# Patient Record
Sex: Male | Born: 1940 | Race: White | Hispanic: No | Marital: Single
Health system: Midwestern US, Academic
[De-identification: ages and names within clinical notes are randomized; demographics above are authoritative.]

---

## 2020-08-05 IMAGING — CT Head^_WITHOUT_CONTRAST (Adult)
1 series · 15 of 30 positions shown, 19 images · non-contrast
Comparison: none

[Series 2: brain w/o 4.8 brain · axial · non-contrast · 0.55mm/px · z∈[+84,+251]mm · 15 of 36 slices shown, 19 images]
[im 2/36  brain]
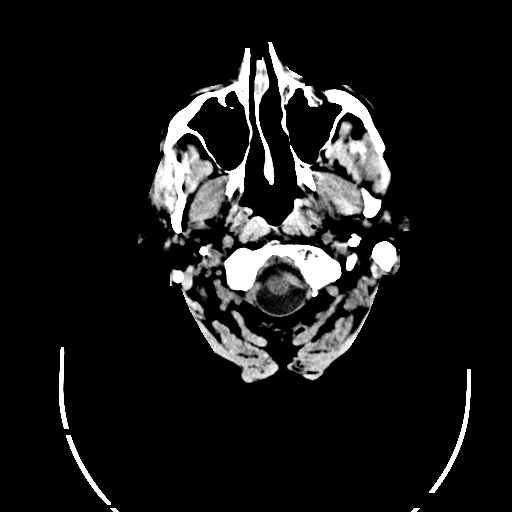
[im 2/36  bone]
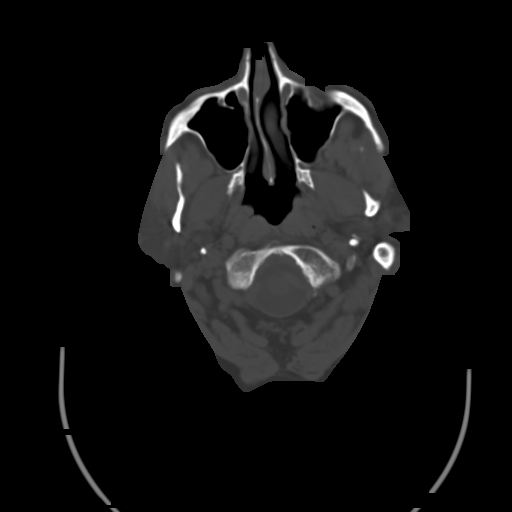
[im 4/36  brain]
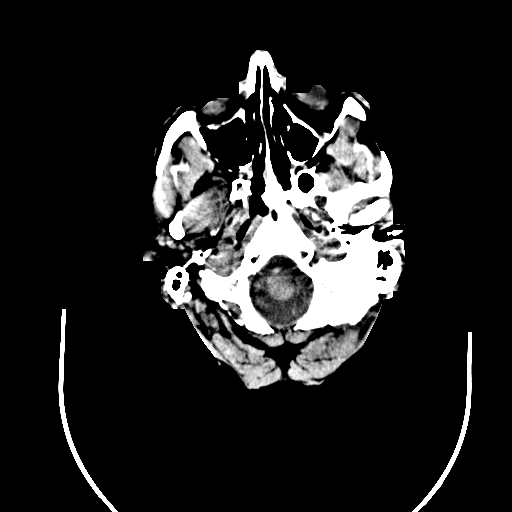
[im 7/36  brain]
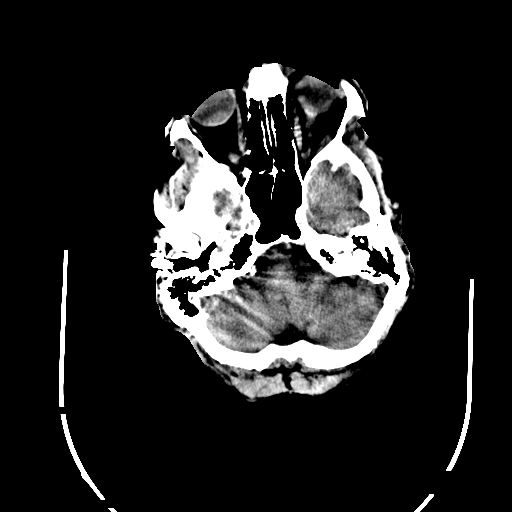
[im 9/36  brain]
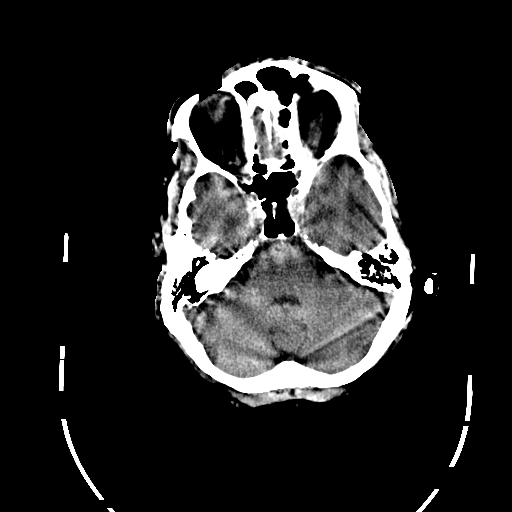
[im 11/36  brain]
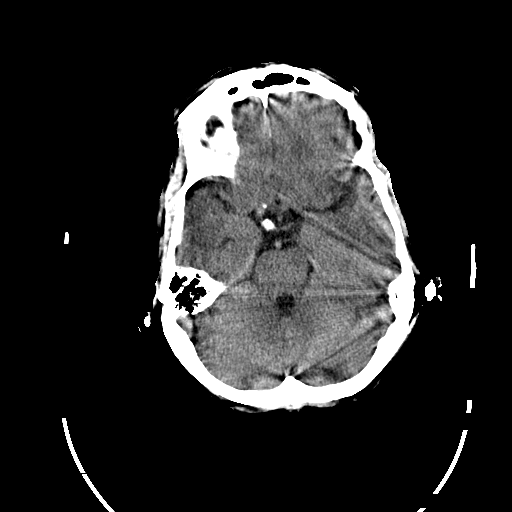
[im 11/36  bone]
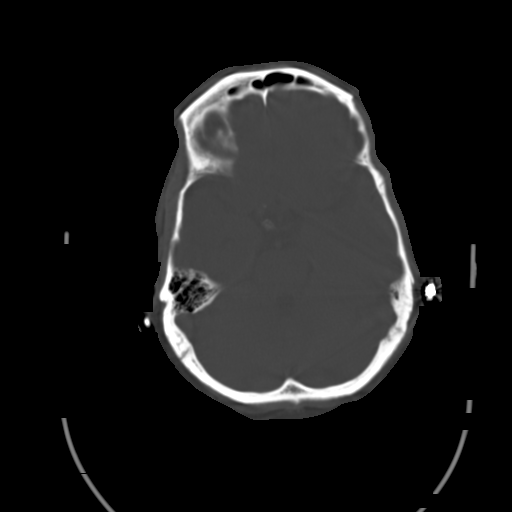
[im 14/36  brain]
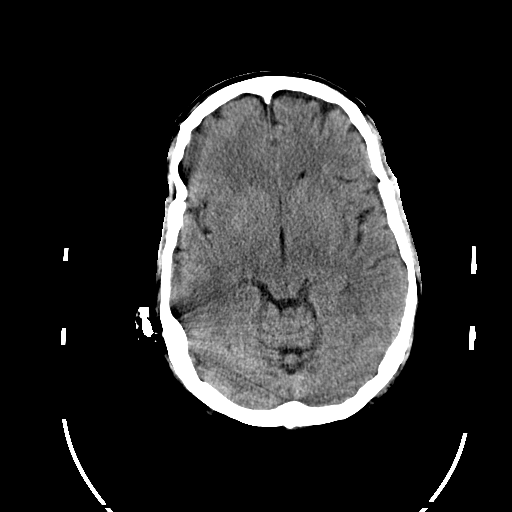
[im 16/36  brain]
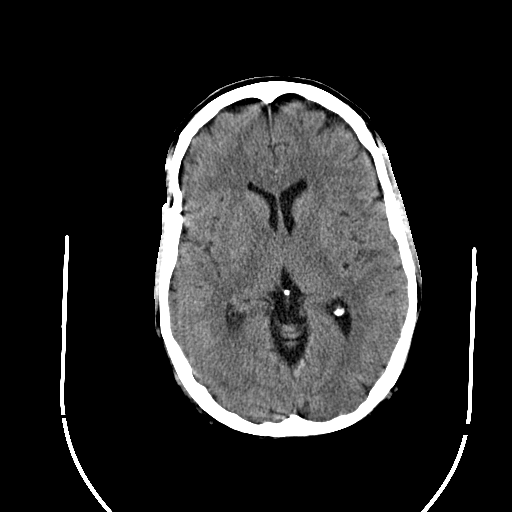
[im 19/36  brain]
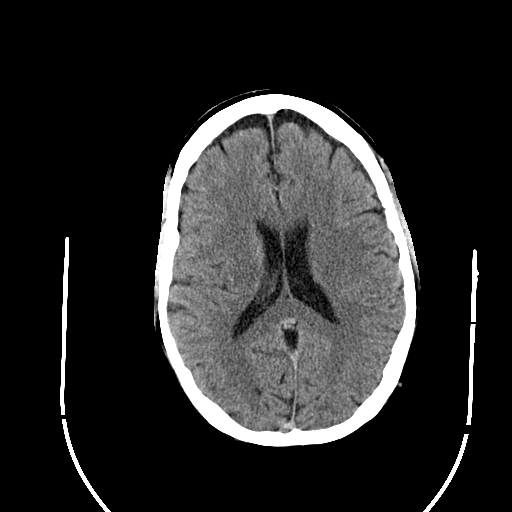
[im 20/36  brain]
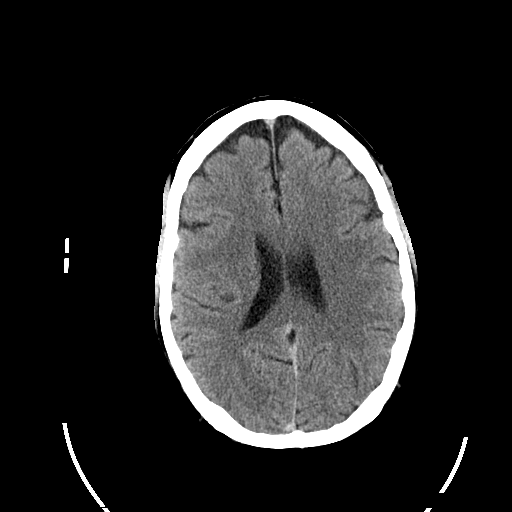
[im 20/36  bone]
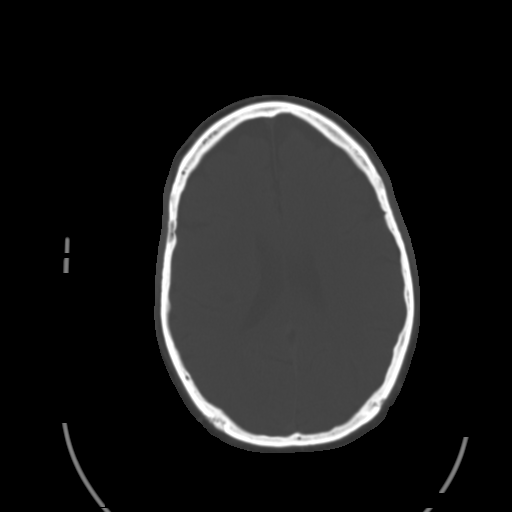
[im 22/36  brain]
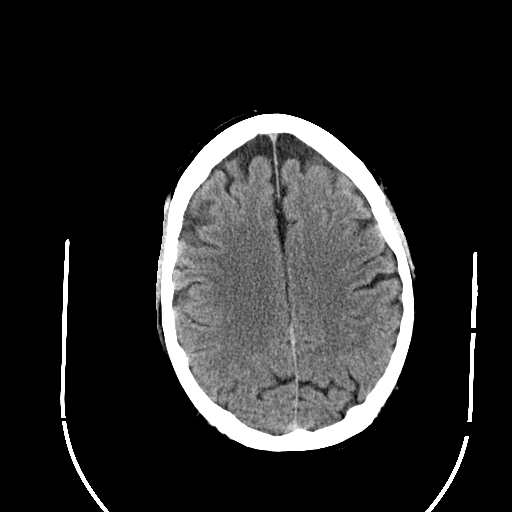
[im 25/36  brain]
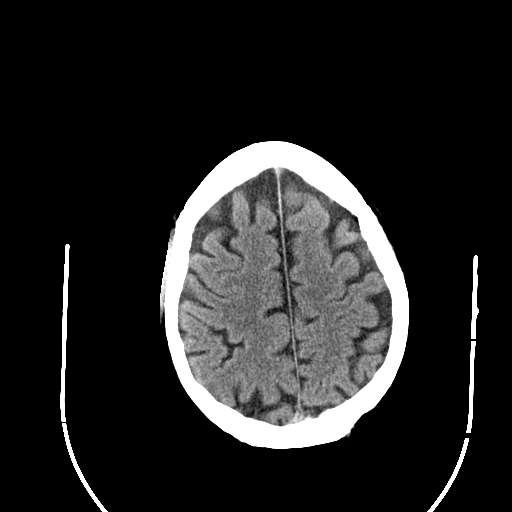
[im 27/36  brain]
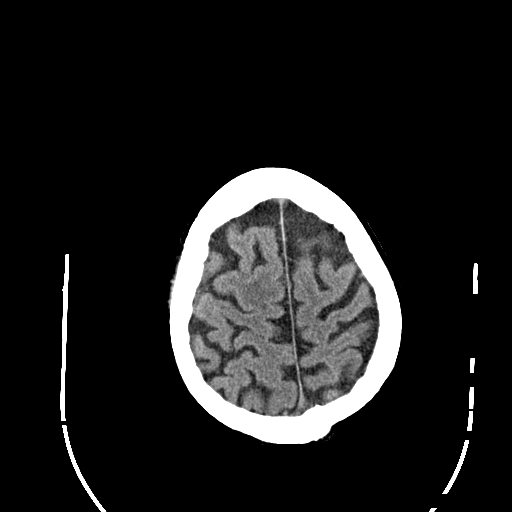
[im 29/36  brain]
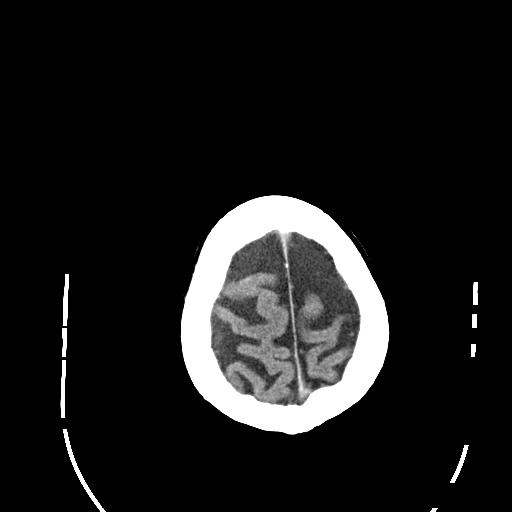
[im 29/36  bone]
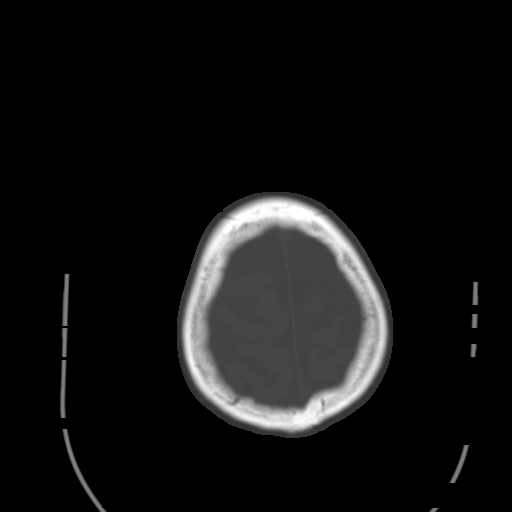
[im 32/36  brain]
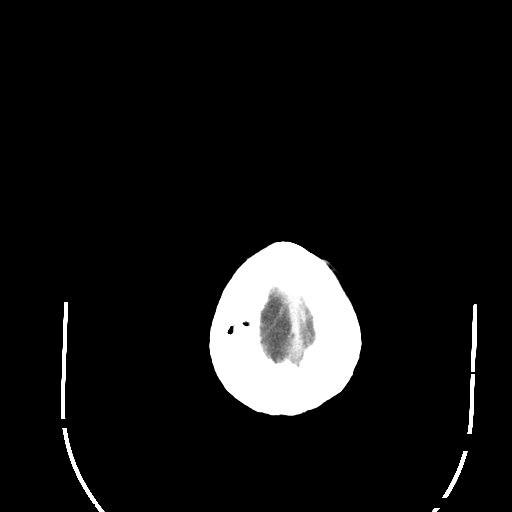
[im 34/36  brain]
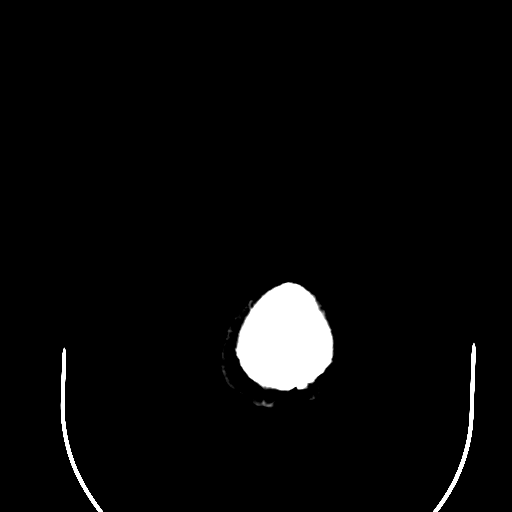

[15 of 30 positions shown; findings below may reference images not displayed]

EXAM

COMPUTED TOMOGRAPHY, HEAD OR BRAIN; WITHOUT CONTRAST MATERIAL CPT 71301

INDICATION

dizzy near syncope
Pt c/o dizziness and near syncope. AW

TECHNIQUE

Multiple contiguous transaxial images were obtained through the brain utilizing a multidetector CT
scanner.

All CT scans at this facility use dose modulation, iterative reconstruction, and/or weight based
dosing when appropriate to reduce radiation dose to as low as reasonably achievable.

COMPARISONS

There are no previous examinations available for comparison at the time of dictation.

FINDINGS

There is diffuse cortical atrophy. The ventricles and sulci are accordingly prominent. There is no
mass effect or shift in the midline structures. Lucency throughout the centrum ovale is consistent
with deep white matter ischemic changes and periventricular leukomalacia. The skull base overlying
and overlying calvarium are within normal limits.

IMPRESSION

Cortical atrophy and age related involutional changes. Periventricular leukomalacia. No acute
intracranial hemorrhage.

Tech Notes:

Pt c/o dizziness and near syncope. AW

## 2020-08-27 ENCOUNTER — Encounter: Admit: 2020-08-27 | Discharge: 2020-08-27 | Payer: MEDICARE

## 2020-08-27 DIAGNOSIS — M1990 Unspecified osteoarthritis, unspecified site: Secondary | ICD-10-CM

## 2020-08-27 DIAGNOSIS — M109 Gout, unspecified: Secondary | ICD-10-CM

## 2020-08-27 DIAGNOSIS — R0600 Dyspnea, unspecified: Secondary | ICD-10-CM

## 2020-08-27 DIAGNOSIS — E785 Hyperlipidemia, unspecified: Secondary | ICD-10-CM

## 2020-08-27 DIAGNOSIS — K219 Gastro-esophageal reflux disease without esophagitis: Secondary | ICD-10-CM

## 2020-08-27 DIAGNOSIS — N200 Calculus of kidney: Secondary | ICD-10-CM

## 2020-12-06 ENCOUNTER — Encounter: Admit: 2020-12-06 | Discharge: 2020-12-06 | Payer: MEDICARE

## 2020-12-06 NOTE — Telephone Encounter
OBC to [facility] to req copy of audio done 08/11/20. Misty Stanley states it will be faxed.

## 2020-12-08 ENCOUNTER — Encounter: Admit: 2020-12-08 | Discharge: 2020-12-08 | Payer: MEDICARE

## 2020-12-08 ENCOUNTER — Ambulatory Visit: Admit: 2020-12-08 | Discharge: 2020-12-08 | Payer: MEDICARE

## 2020-12-08 DIAGNOSIS — H903 Sensorineural hearing loss, bilateral: Secondary | ICD-10-CM

## 2020-12-08 DIAGNOSIS — R0600 Dyspnea, unspecified: Secondary | ICD-10-CM

## 2020-12-08 DIAGNOSIS — N200 Calculus of kidney: Secondary | ICD-10-CM

## 2020-12-08 DIAGNOSIS — E785 Hyperlipidemia, unspecified: Secondary | ICD-10-CM

## 2020-12-08 DIAGNOSIS — M109 Gout, unspecified: Secondary | ICD-10-CM

## 2020-12-08 DIAGNOSIS — M1990 Unspecified osteoarthritis, unspecified site: Secondary | ICD-10-CM

## 2020-12-08 DIAGNOSIS — K219 Gastro-esophageal reflux disease without esophagitis: Secondary | ICD-10-CM

## 2020-12-08 NOTE — Patient Instructions
Cochlear Implant Patients :     Prevnar 13 Vaccine has to be done before Surgery with your Primary Care Physician.     Please have your Primary Care Physician fax your Immunization record to the ENT clinic at # [phone].    General Instructions for Surgery Patients    Your Surgery is scheduled for 03/18/2021 with Dr. Paulene Floor      Preparing for Surgery      ? You will get a call from a member of our preregistration team to complete your registration.   If you have any insurance changes prior to your surgery please call [phone] to provide   the updated insurance information.  ? Hold Vitamin E, Vitamin A, Fish oil, multivitamin, and herbal supplements for 14     days prior to surgery.  Hold Ibuprofen (Advil, Motrin), Naproxen (Aleve), and any     other Non-Steroidal Anti-inflammatory medications for 7 days prior to surgery.  If     you take aspirin or any blood thinners, please contact the prescribing physician     for recommendation regarding holding these medications prior to surgery.      Do not stop taking any heart medications or aspirin without seeking the     Approval of your Cardiologist.    ? Refrain from smoking two weeks before and two weeks after surgery.  Nicotine     and tobacco smoke delays healing and can result in scarring. This is the perfect     time to give up the habit.    ? Do not eat or drink anything, including water, after midnight the night before your     surgery.    ? Arrange for someone to take you home from the hospital.      ? Surgery is scheduled at [facility]. The address is [address] [place], [place] [address]. [facility] is on the main hospital campus at the   [employer] [address].  Park in the P5 parking garage off of [address].   Psychologist, sport and exercise the [facility] through the 1st floor main entrance.   Check in with admitting on 1st floor.   (If you are pre-registered you will go directly to either 2nd floor if going to IR, or 3rd floor to surgery).   Check in with surgical reception center (Waiting Room Attendant) after being registered.  You will be escorted by staff up to OR.  Family will need to expect to wait on 1st level in common areas except during certain points in the visit when they can accompany you.   You will be contacted between 2:30 and 4:00 on the last business day before your procedure. If you do not hear from the Preoperative Assessment Clinic to confirm your arrival time call [phone] before 4:30 p.m.     The Day of Surgery      ? Do not eat or drink anything, including water, after midnight the morning of     surgery.      Essential medication may be taken with a sip of water.    ? Wear loose-fitting clothes that fasten in front or back.  Avoid clothing that pulls     over your head.    ? Leave all valuables at home; do not wear jewelry.    ? Do not wear any facial or eye make-up.  Avoid nail polish.    ? You may wear glasses but  do not wear contact lenses.   ? If you wear dentures, keep them in.    ? Bring ID and insurance card with you.     Restrictions --  No strenuous activity or lifting over 10 pounds for two (2) weeks following your Surgery.     If you are concerned about anything you consider significant, call us at [phone]--during business hours, ask for your nurse, Sherron Monday, or the on-call nurse. After hours ask to speak to the ENT doctor on call.      Please call our office if the following should develop after surgery:  a) Cold or sinus infection  b) Foul smelling or excessive drainage from the incision site  c) Temperature greater than 101  d) Excessive swelling or bleeding  e) Persistent nausea or vomiting

## 2020-12-09 ENCOUNTER — Ambulatory Visit: Admit: 2020-12-09 | Discharge: 2020-12-09 | Payer: MEDICARE

## 2020-12-09 ENCOUNTER — Encounter: Admit: 2020-12-09 | Discharge: 2020-12-09 | Payer: MEDICARE

## 2020-12-09 DIAGNOSIS — H903 Sensorineural hearing loss, bilateral: Secondary | ICD-10-CM

## 2020-12-09 NOTE — Telephone Encounter
CT order faxed to [facility].

## 2021-02-21 ENCOUNTER — Encounter: Admit: 2021-02-21 | Discharge: 2021-02-21 | Payer: MEDICARE

## 2021-02-21 DIAGNOSIS — M109 Gout, unspecified: Secondary | ICD-10-CM

## 2021-02-21 DIAGNOSIS — M1990 Unspecified osteoarthritis, unspecified site: Secondary | ICD-10-CM

## 2021-02-21 DIAGNOSIS — K219 Gastro-esophageal reflux disease without esophagitis: Secondary | ICD-10-CM

## 2021-02-21 DIAGNOSIS — N200 Calculus of kidney: Secondary | ICD-10-CM

## 2021-02-21 DIAGNOSIS — E785 Hyperlipidemia, unspecified: Secondary | ICD-10-CM

## 2021-02-21 DIAGNOSIS — R0600 Dyspnea, unspecified: Secondary | ICD-10-CM

## 2021-03-03 ENCOUNTER — Encounter: Admit: 2021-03-03 | Discharge: 2021-03-03 | Payer: MEDICARE

## 2021-03-03 NOTE — Telephone Encounter
caregiver calling in regards to cochlear options

## 2021-03-18 ENCOUNTER — Encounter: Admit: 2021-03-18 | Discharge: 2021-03-18 | Payer: MEDICARE

## 2021-03-18 ENCOUNTER — Ambulatory Visit: Admit: 2021-03-18 | Discharge: 2021-03-18 | Payer: MEDICARE

## 2021-03-18 MED ORDER — FENTANYL CITRATE (PF) 50 MCG/ML IJ SOLN
INTRAVENOUS | 0 refills | Status: DC
Start: 2021-03-18 — End: 2021-03-18
  Administered 2021-03-18: 19:00:00 100 ug via INTRAVENOUS
  Administered 2021-03-18: 20:00:00 50 ug via INTRAVENOUS
  Administered 2021-03-18: 20:00:00 25 ug via INTRAVENOUS

## 2021-03-18 MED ORDER — REMIFENTANYL 1000MCG IN NS 20ML (OR)
INTRAVENOUS | 0 refills | Status: DC
Start: 2021-03-18 — End: 2021-03-18
  Administered 2021-03-18: 19:00:00 50 ug via INTRAVENOUS
  Administered 2021-03-18: 19:00:00 .05 ug/kg/min via INTRAVENOUS
  Administered 2021-03-18: 19:00:00 50 ug via INTRAVENOUS
  Administered 2021-03-18: 19:00:00 .05 ug/kg/min via INTRAVENOUS

## 2021-03-18 MED ORDER — PHENYLEPHRINE 40 MCG/ML IN NS IV DRIP (STD CONC)
INTRAVENOUS | 0 refills | Status: DC
Start: 2021-03-18 — End: 2021-03-18
  Administered 2021-03-18 (×2): .5 ug/kg/min via INTRAVENOUS

## 2021-03-18 MED ORDER — LIDOCAINE (PF) 200 MG/10 ML (2 %) IJ SYRG
INTRAVENOUS | 0 refills | Status: DC
Start: 2021-03-18 — End: 2021-03-18
  Administered 2021-03-18: 19:00:00 60 mg via INTRAVENOUS

## 2021-03-18 MED ORDER — DEXAMETHASONE SODIUM PHOSPHATE 10 MG/ML IJ SOLN
INTRAVENOUS | 0 refills | Status: DC
Start: 2021-03-18 — End: 2021-03-18
  Administered 2021-03-18: 19:00:00 10 mg via INTRAVENOUS

## 2021-03-18 MED ORDER — SUCCINYLCHOLINE CHLORIDE 20 MG/ML IJ SOLN
INTRAVENOUS | 0 refills | Status: DC
Start: 2021-03-18 — End: 2021-03-18
  Administered 2021-03-18: 19:00:00 120 mg via INTRAVENOUS

## 2021-03-18 MED ORDER — ONDANSETRON HCL (PF) 4 MG/2 ML IJ SOLN
INTRAVENOUS | 0 refills | Status: DC
Start: 2021-03-18 — End: 2021-03-18
  Administered 2021-03-18: 20:00:00 4 mg via INTRAVENOUS

## 2021-03-18 MED ORDER — EPHEDRINE SULFATE 50 MG/5ML SYR (10 MG/ML) (AN)(OSM)
INTRAVENOUS | 0 refills | Status: DC
Start: 2021-03-18 — End: 2021-03-18
  Administered 2021-03-18 (×2): 5 mg via INTRAVENOUS

## 2021-03-18 MED ORDER — DEXAMETHASONE SODIUM PHOSPHATE 4 MG/ML IJ SOLN
INTRAVENOUS | 0 refills | Status: DC
Start: 2021-03-18 — End: 2021-03-18
  Administered 2021-03-18: 19:00:00 10 mg via INTRAVENOUS

## 2021-03-18 MED ORDER — ARTIFICIAL TEARS (PF) SINGLE DOSE DROPS GROUP
OPHTHALMIC | 0 refills | Status: DC
Start: 2021-03-18 — End: 2021-03-18
  Administered 2021-03-18: 19:00:00 1 [drp] via OPHTHALMIC

## 2021-03-18 MED ORDER — PROPOFOL INJ 10 MG/ML IV VIAL
INTRAVENOUS | 0 refills | Status: DC
Start: 2021-03-18 — End: 2021-03-18
  Administered 2021-03-18: 19:00:00 150 mg via INTRAVENOUS

## 2021-03-18 MED ORDER — CEFAZOLIN 1 GRAM IJ SOLR
INTRAVENOUS | 0 refills | Status: DC
Start: 2021-03-18 — End: 2021-03-18
  Administered 2021-03-18: 19:00:00 2 g via INTRAVENOUS

## 2021-03-18 MED ORDER — PROPOFOL 10 MG/ML IV EMUL 100 ML (INFUSION)(AM)(OR)
INTRAVENOUS | 0 refills | Status: DC
Start: 2021-03-18 — End: 2021-03-18
  Administered 2021-03-18: 19:00:00 120 ug/kg/min via INTRAVENOUS

## 2021-03-18 MED ADMIN — SODIUM CHLORIDE 0.9% IRRIGATION BAG [210330]: 3000 mL | @ 18:00:00 | Stop: 2021-03-18 | NDC 00338004747

## 2021-03-18 MED ADMIN — EPINEPHRINE 1 MG/ML IJ SOLN [2850]: 2 mL | INTRAVENOUS | @ 18:00:00 | Stop: 2021-03-18 | NDC 42023016801

## 2021-03-18 MED ADMIN — CEFAZOLIN 1 GRAM IJ SOLR [1445]: 3000 mL | @ 18:00:00 | Stop: 2021-03-18 | NDC 00143992490

## 2021-03-18 MED ADMIN — SODIUM CHLORIDE 0.9 % IV SOLP [27838]: 1000 mL | INTRAVENOUS | @ 17:00:00 | Stop: 2021-03-18 | NDC 00338004904

## 2021-03-18 MED ADMIN — SODIUM CHLORIDE 0.9 % IR SOLN [11403]: 1000 mL | @ 18:00:00 | Stop: 2021-03-18 | NDC 00338004804

## 2021-03-18 MED ADMIN — CEFAZOLIN 1 GRAM IJ SOLR [1445]: 1000 mL | @ 18:00:00 | Stop: 2021-03-18 | NDC 00143992490

## 2021-03-18 MED ADMIN — LIDOCAINE-EPINEPHRINE 1 %-1:100,000 IJ SOLN [15955]: 10 mL | INTRAMUSCULAR | @ 19:00:00 | Stop: 2021-03-18 | NDC 00409317817

## 2021-03-18 MED FILL — TRAMADOL 50 MG PO TAB: 50 mg | ORAL | 4 days supply | Qty: 12 | Fill #1 | Status: CP

## 2021-03-18 MED FILL — CEPHALEXIN 500 MG PO CAP: 500 mg | ORAL | 5 days supply | Qty: 15 | Fill #1 | Status: CP

## 2021-03-18 MED FILL — METHYLPREDNISOLONE 4 MG PO DSPK: 4 mg | 6 days supply | Qty: 21 | Fill #1 | Status: CP

## 2021-03-18 NOTE — Anesthesia Post-Procedure Evaluation
Post-Anesthesia Evaluation    Name: Robert Woodard      MRN: [id number]     DOB: 1940/12/31     Age: 80 y.o.     Sex: male   __________________________________________________________________________     Procedure Information     Anesthesia Start Date/Time: 03/18/21 1234    Procedure: C9449 - IMPLANTATION COCHLEAR DEVICE (Right: Head) - 75 mins    Location: [facility] OR09 / [facility] OR/Periop    Surgeons: Paulene Floor, MD          Post-Anesthesia Vitals  BP: 150/98 (12/02 1430)  Temp: 36 C (96.8 F) (12/02 1409)  Pulse: 83 (12/02 1430)  Respirations: 14 PER MINUTE (12/02 1430)  SpO2: 94 % (12/02 1430)  SpO2 Pulse: 81 (12/02 1430)  O2 Device: None (Room air) (12/02 1430)   Vitals Value Taken Time   BP 150/98 03/18/21 1430   Temp 36 C (96.8 F) 03/18/21 1409   Pulse 83 03/18/21 1430   Respirations 14 PER MINUTE 03/18/21 1430   SpO2 94 % 03/18/21 1430   O2 Device None (Room air) 03/18/21 1430   ABP     ART BP           Post Anesthesia Evaluation Note    Evaluation location: Pre/Post  Patient participation: recovered; patient participated in evaluation  Level of consciousness: alert    Pain score: Pain scale: adequate.  Pain management: adequate    Hydration: normovolemia  Temperature: 36.0C - 38.4C  Airway patency: adequate    Perioperative Events       Post-op nausea and vomiting: no PONV    Postoperative Status  Cardiovascular status: hemodynamically stable  Respiratory status: spontaneous ventilation  Follow-up needed: none        Perioperative Events  There were no known notable events for this encounter.

## 2021-03-18 NOTE — Anesthesia Pre-Procedure Evaluation
Anesthesia Pre-Procedure Evaluation    Name: Robert Woodard      MRN: [id number]     DOB: [birth year]     Age: 80 y.o.     Sex: male   _________________________________________________________________________     Procedure Info:   Choose an anesthesia record to view details           Physical Assessment  Vital Signs (last filed in past 24 hours):  BP: 127/72 (12/02 1100)  Temp: 36.6 ?C (97.8 ?F) (12/02 1100)  Pulse: 79 (12/02 1100)  Respirations: 12 PER MINUTE (12/02 1100)  SpO2: 97 % (12/02 1100)  O2 Device: None (Room air) (12/02 1100)  Height: 193 cm (6' 4) (12/02 1100)  Weight: 98.4 kg (217 lb) (12/02 1100)  SpO2 Pulse: 78 (12/02 1100)      Patient History   No Known Allergies     Current Medications    Medication Directions   allopurinol (ZYLOPRIM) 300 mg PO tablet Take 300 mg by mouth daily.   Calcium Carbonate 1,000 mg PO Tab Take 1 Tab by mouth daily.   ergocalciferol (vitamin D2) (VITAMIN D PO) Take  by mouth.   finasteride (PROSCAR) 5 mg tablet    fish oil /omega-3 fatty acids (SEA-OMEGA) 340/1000 mg PO capsule Take 1 Cap by mouth three times daily.   glucosamine-chondroitin 500-400 mg PO tablet Take 1 Tab by mouth twice daily.   vitamins, multiple PO Cap Take 1 Cap by mouth daily.         Review of Systems/Medical History      Patient summary reviewed  Pertinent labs reviewed    PONV Screening: Non-smoker and Postoperative opioids        Pulmonary          No shortness of breath      Cardiovascular         Exercise tolerance: >4 METS      Beta Blocker therapy: No      Beta blockers within 24 hours: n/a      Hypertension,       Hyperlipidemia      No dyspnea on exertion      GI/Hepatic/Renal           GERD, well controlled      Gout      Neuro/Psych       Seizures      No CVA      Musculoskeletal         Arthritis      Endocrine/Other       No diabetes      No hypothyroidism      No hyperthyroidism   Physical Exam    Airway Findings      Mallampati: III      TM distance: >3 FB      Neck ROM: full Mouth opening: good    Dental Findings: Negative      Cardiovascular Findings:       Rhythm: regular      Rate: normal    Pulmonary Findings:       Breath sounds clear to auscultation.    Abdominal Findings: Negative      Neurological Findings:       Alert and oriented x 3    Constitutional findings:       No acute distress       Diagnostic Tests  Hematology:   Lab Results   Component Value Date    HGB 14.8 10/25/2009  HCT 44.3 10/25/2009    PLTCT 217 10/25/2009    WBC 6.0 10/25/2009    MCV 97.6 10/25/2009    MCH 32.6 10/25/2009    MCHC 33.5 10/25/2009    RDW 13.3 10/25/2009         General Chemistry:   Lab Results   Component Value Date    NA 138 11/03/2009    K 4.7 11/03/2009    CL 102 11/03/2009    CO2 30.8 11/03/2009    GAP 10 11/03/2009    BUN 23 11/03/2009    CR 1.2 11/03/2009    GLU 85 11/03/2009    CA 9.3 11/03/2009    ALBUMIN 4.1 11/03/2009    TOTBILI 0.59 11/03/2009      Coagulation: No results found for: PT, PTT, INR      Anesthesia Plan    ASA score: 2   Plan: general  Induction method: intravenous  NPO status: acceptable      Informed Consent  Anesthetic plan and risks discussed with patient.  Use of blood products discussed with patient  Blood Consent: consented      Plan discussed with: anesthesiologist, CRNA and SRNA.

## 2021-03-29 ENCOUNTER — Encounter: Admit: 2021-03-29 | Discharge: 2021-03-29 | Payer: MEDICARE

## 2021-03-29 DIAGNOSIS — M1990 Unspecified osteoarthritis, unspecified site: Secondary | ICD-10-CM

## 2021-03-29 DIAGNOSIS — K219 Gastro-esophageal reflux disease without esophagitis: Secondary | ICD-10-CM

## 2021-03-29 DIAGNOSIS — R0600 Dyspnea, unspecified: Secondary | ICD-10-CM

## 2021-03-29 DIAGNOSIS — M109 Gout, unspecified: Secondary | ICD-10-CM

## 2021-03-29 DIAGNOSIS — E785 Hyperlipidemia, unspecified: Secondary | ICD-10-CM

## 2021-03-29 DIAGNOSIS — N200 Calculus of kidney: Secondary | ICD-10-CM

## 2021-03-31 NOTE — Progress Notes
Patient is s/p implantation of cochlear device on the right with Dr. Juel Burrow on 03/18/21.     The patient has not experienced dizziness or headache in the interval.     All incisions are clean and dry without signs of infection or poor healing. There are no fluid collections. EAC clear, hemotympanum noted.     The patient is recovering well from implantation of cochlear device. Patient was handed off to Sherril Croon, AuD for activation and for follow up recommendations. See Dr. Juel Burrow 1 month to ensure resolution of hemotympanum.

## 2021-04-01 ENCOUNTER — Ambulatory Visit: Admit: 2021-04-01 | Discharge: 2021-04-01 | Payer: MEDICARE

## 2021-04-01 ENCOUNTER — Encounter: Admit: 2021-04-01 | Discharge: 2021-04-01 | Payer: MEDICARE

## 2021-04-01 DIAGNOSIS — H903 Sensorineural hearing loss, bilateral: Secondary | ICD-10-CM

## 2021-04-01 NOTE — Progress Notes
Robert Woodard was seen in the clinic today for initial activation of his Advanced Bionics cochlear implant.     Patient History:   Father [name] has a history of bilateral progressive SNHL. He has worn hearing aids for several years. His most recent pair was fit a Biochemist, clinical.     Transport planner Internal Device  Initial Stimulation Date  Surgery Date  Time Post Initial Stimulation Surgeon    R Advanced Bionics Mid scalar ultra 3D version 2 04/01/2021 03/18/2021  Paulene Floor, MD      Equipment:   Ear  Processor(s)  Serial Number  Magnet Strength  Processor Condition    R Naida CI M90  4 new     Equipment: new    Incision site: some redness  Magnet site: good   Otoscopy: DNT    Programming:   ? Impedances: within normal limits  ? Electrodes:   ? Measured M levels to most comfortable  ? Turned down M's prior to going live  ? Settings were set to a comfortable volume   ? Progressive levels were turned on    Aided Testing:  Testing was completed with a  processor on the right/left ear. In the CI only condition, the contralateral ear was plugged.  Test      Az Bio (quiet) - CI only      Az Bio (quiet) - CI + HA      Az Bio (+5 dB SNR) - CI + HA      CNC (words) - CI only      CNC (phonemes) - CI only      CNC (words) - CI + HA      CNC (phonemes) - CI + HA      Evaluation of Aural Rehabilitation Status:     ? Aided thresholds were not tested today.    Hearing Aid:  Manufacturer: Phonak   Model: Naida M90 link   Serial Numbers: [id number]   Repair Warranty:   L&D Warranty:   Battery: 13  Domes: medium power domes  Slimtube: Size 2    Summary:  Programming was completed to [name]'s satisfaction. The hearing aid was also programmed today to 100% target gain. Basic use of the implant was reviewed. He was able to take the batteries on and off without difficulties. His cable was pretty long with the placement of his magnet so a shorter cable will be ordered. We will have it shipped to clinic. We will also get a Phonak partner mic for [name] so he can have a way to hear during confessions.  He was instructed to increase the volume on his implant as he sees fit.     Recommendations:   [name] should return to the clinic in 2 weeks for re-programming.

## 2021-04-13 ENCOUNTER — Ambulatory Visit: Admit: 2021-04-13 | Discharge: 2021-04-13 | Payer: MEDICARE

## 2021-04-13 ENCOUNTER — Encounter: Admit: 2021-04-13 | Discharge: 2021-04-13 | Payer: MEDICARE

## 2021-04-13 DIAGNOSIS — H903 Sensorineural hearing loss, bilateral: Secondary | ICD-10-CM

## 2021-04-13 NOTE — Progress Notes
Robert Woodard was seen in the clinic today for two week programming of his Advanced Bionics cochlear implant. He reports he is still hearing noise but it is getting better. He came in on three progressive levels louder than the MAP we created.     Patient History:   Father Robert Woodard has a history of bilateral progressive SNHL. He has worn hearing aids for several years. His most recent pair was fit a Biochemist, clinical.     Transport planner Internal Device  Initial Stimulation Date  Surgery Date  Time Post Initial Stimulation Surgeon    R Advanced Bionics Mid scalar ultra 3D version 2 04/01/2021 03/18/2021  Robert Floor, MD      Equipment:   Ear  Processor(s)  Serial Number  Magnet Strength  Processor Condition    R Naida CI M90  4 new     Equipment: new    Incision site: healed  Magnet site: good   Otoscopy: DNT    Programming:   ? Impedances: within normal limits  ? Electrodes: disabled 16 today due to large difference in loudness precept   ? Measured M levels to most comfortable  ? Turned down M's prior to going live  ? Settings were set to a comfortable volume   ? Progressive levels were turned on    Aided Testing: Did not test today    Testing was completed with a  processor on the right/left ear. In the CI only condition, the contralateral ear was plugged.  Test      Az Bio (quiet) - CI only      Az Bio (quiet) - CI + HA      Az Bio (+5 dB SNR) - CI + HA      CNC (words) - CI only      CNC (phonemes) - CI only      CNC (words) - CI + HA      CNC (phonemes) - CI + HA      Evaluation of Aural Rehabilitation Status:     ? Aided thresholds were not tested today.    Hearing Aid  Manufacturer: Phonak   Model: Naida M90 link   Serial Numbers: [id number]   Repair Warranty:   L&D Warranty:   Battery: 13  Domes: medium power domes  Slimtube: Size 2    Summary:  Programming was completed to Robert Woodard's satisfaction. He reports improved sound quality with the adjustments made today. The Phonak partner microphone was paired to his hearing aid and implant today. Questions regarding extra parts and the dryer were reviewed. Progressive levels were turned on again for him to increase if he deems comfortable.       Recommendations:   Robert Woodard should return to the clinic as scheduled for re-programming.

## 2021-04-29 ENCOUNTER — Inpatient Hospital Stay: Admit: 2021-04-29 | Discharge: 2021-04-29 | Payer: MEDICARE

## 2021-04-29 ENCOUNTER — Encounter: Admit: 2021-04-29 | Discharge: 2021-04-29 | Payer: MEDICARE

## 2021-04-29 DIAGNOSIS — H903 Sensorineural hearing loss, bilateral: Secondary | ICD-10-CM

## 2021-04-29 DIAGNOSIS — I4892 Unspecified atrial flutter: Secondary | ICD-10-CM

## 2021-04-29 LAB — CBC AND DIFF
ABSOLUTE BASO COUNT: 0 K/UL (ref 0–0.20)
BASOPHILS %: 1 % (ref 0–2)
MPV: 7.4 FL (ref 7–11)
NEUTROPHILS %: 67 % (ref 41–77)
WBC COUNT: 8.2 K/UL (ref 4.5–11.0)

## 2021-04-29 LAB — LIPID PROFILE
CHOLESTEROL: 187 mg/dL (ref <200)
LDL: 130 mg/dL — ABNORMAL HIGH (ref <100)
NON HDL CHOLESTEROL: 157 mg/dL
TRIGLYCERIDES: 225 mg/dL — ABNORMAL HIGH (ref <150)
VLDL: 45 mg/dL

## 2021-04-29 LAB — COMPREHENSIVE METABOLIC PANEL
ALBUMIN: 4.3 g/dL (ref 3.5–5.0)
ALK PHOSPHATASE: 75 U/L (ref 25–110)
ALT: 21 U/L (ref 7–56)
ANION GAP: 12 K/UL — ABNORMAL HIGH (ref 3–12)
BLD UREA NITROGEN: 24 mg/dL (ref 7–25)
CO2: 24 MMOL/L (ref 21–30)
CREATININE: 1.3 mg/dL — ABNORMAL HIGH (ref 0.4–1.24)
EGFR: 55 mL/min — ABNORMAL LOW (ref >60)
POTASSIUM: 4.3 MMOL/L (ref 3.5–5.1)
SODIUM: 140 MMOL/L (ref 137–147)
TOTAL BILIRUBIN: 1.2 mg/dL — ABNORMAL LOW (ref 0.3–1.2)

## 2021-04-29 LAB — TSH WITH FREE T4 REFLEX: TSH: 4.8 uU/mL — ABNORMAL LOW (ref >40)

## 2021-04-29 LAB — BNP (B-TYPE NATRIURETIC PEPTI): BNP: 714 pg/mL — ABNORMAL HIGH (ref 0–100)

## 2021-04-29 LAB — PTT (APTT): PTT: 32 s — ABNORMAL HIGH (ref 24.0–36.5)

## 2021-04-29 LAB — PROTIME INR (PT): PROTIME: 11 s (ref 9.5–14.2)

## 2021-04-29 LAB — PHOSPHORUS: PHOSPHORUS: 2.5 mg/dL (ref 2.0–4.5)

## 2021-04-29 MED ORDER — DILTIAZEM 125MG/125ML NS IV DRIP
5-15 mg/h | INTRAVENOUS | 0 refills | Status: AC
Start: 2021-04-29 — End: ?
  Administered 2021-04-30 – 2021-05-01 (×8): 15 mg/h via INTRAVENOUS

## 2021-04-29 MED ORDER — MELATONIN 5 MG PO TAB
5 mg | Freq: Every evening | ORAL | 0 refills | Status: AC | PRN
Start: 2021-04-29 — End: ?

## 2021-04-29 MED ORDER — ALLOPURINOL 300 MG PO TAB
300 mg | Freq: Every day | ORAL | 0 refills
Start: 2021-04-29 — End: ?

## 2021-04-29 MED ORDER — ENOXAPARIN 40 MG/0.4 ML SC SYRG
40 mg | Freq: Every day | SUBCUTANEOUS | 0 refills | Status: DC
Start: 2021-04-29 — End: 2021-04-30

## 2021-04-29 MED ORDER — LACTATED RINGERS IV SOLP
1000 mL | Freq: Once | INTRAVENOUS | 0 refills | Status: AC
Start: 2021-04-29 — End: ?
  Administered 2021-04-30: 07:00:00 1000 mL via INTRAVENOUS

## 2021-04-29 MED ORDER — SENNOSIDES-DOCUSATE SODIUM 8.6-50 MG PO TAB
1 | Freq: Every day | ORAL | 0 refills | Status: AC | PRN
Start: 2021-04-29 — End: ?

## 2021-04-29 MED ORDER — APIXABAN 2.5 MG PO TAB
2.5 mg | Freq: Two times a day (BID) | ORAL | 0 refills | Status: AC
Start: 2021-04-29 — End: ?
  Administered 2021-04-30 – 2021-05-01 (×3): 2.5 mg via ORAL

## 2021-04-29 MED ORDER — FINASTERIDE 5 MG PO TAB
5 mg | Freq: Every day | ORAL | 0 refills
Start: 2021-04-29 — End: ?

## 2021-04-29 MED ORDER — ONDANSETRON HCL (PF) 4 MG/2 ML IJ SOLN
4 mg | INTRAVENOUS | 0 refills | Status: AC | PRN
Start: 2021-04-29 — End: ?

## 2021-04-29 MED ORDER — ONDANSETRON 4 MG PO TBDI
4 mg | ORAL | 0 refills | Status: AC | PRN
Start: 2021-04-29 — End: ?

## 2021-04-29 MED ORDER — DILTIAZEM 125MG/125ML NS IV DRIP
5 mg/h | INTRAVENOUS | 0 refills | Status: DC
Start: 2021-04-29 — End: 2021-04-30
  Administered 2021-04-30 (×2): 5 mg/h via INTRAVENOUS

## 2021-04-29 MED ORDER — POLYETHYLENE GLYCOL 3350 17 GRAM PO PWPK
1 | Freq: Every day | ORAL | 0 refills | Status: AC | PRN
Start: 2021-04-29 — End: ?

## 2021-04-30 ENCOUNTER — Inpatient Hospital Stay: Admit: 2021-04-30 | Discharge: 2021-04-30 | Payer: MEDICARE

## 2021-04-30 ENCOUNTER — Inpatient Hospital Stay: Admit: 2021-04-30 | Payer: MEDICARE

## 2021-04-30 MED ADMIN — PERFLUTREN LIPID MICROSPHERES 1.1 MG/ML IV SUSP [79178]: 3 mL | INTRAVENOUS | @ 18:00:00 | Stop: 2021-04-30 | NDC 11994001116

## 2021-04-30 NOTE — Progress Notes
Patient is seen with resident. 81 years old with previous history of gout is a transfer here for new onset atrial flutter. Patient reports feeling very tired and exhausted since yesterday. His rate ranging between 130-140. Patient denies any chest pain/shortness.   We will start on cardizem drip.  His CHADSVASC is at least 2 for age>75. We will start eliquis. Consult cardiology. Obtain echo.     Iona Beard, MD  Internal medicine physician

## 2021-04-30 NOTE — Care Coordination-Inpatient
Med Teaching TBD  to continue to take calls on this patient until 8:00 am. After 8:00 am please page Med 3

## 2021-04-30 NOTE — H&P (View-Only)
Admission History and Physical Examination      Name:  Robert Woodard                                             MRN:  [id number]   Admission Date:  04/29/2021                     Assessment/Plan:    Active Problems:    Arthritis    Gout    GERD (gastroesophageal reflux disease)    Sensorineural hearing loss (SNHL) of both ears    Atrial flutter (HCC)    Cochlear implant in place    BPH (benign prostatic hyperplasia)    Mylen Hammock is a 81 y.o. male with past medical history of gout, benign prostatic hyperplasia, sensorineural hearing loss, who was transferred from [facility] for chronic fatigue and found to have new onset atrial flutter.    #Atrial flutter, new onset  #Worsening fatigue  -Patient presented to his PCP clinic on 1/13 with with complaint of worsening fatigue  -Found to have a heart rate of 130 at the clinic and was directed to present to the ED  -Patient hemodynamically stable at OSH ED  -EKG at OSH showed atrial flutter with a heart rate in the 130s, blood pressure 110/80s.  Patient was started on a dill drip with IV fluid hydration and was transferred to [place]  -Chest x-ray at OSH unremarkable.  Labs largely within normal limits with the exception of creatinine 1.44  -Last echocardiogram in 2019: normal left ventricular size.  Left ventricular ejection fraction 73%.  Normal left ventricular wall thickness.  Normal tricuspid valve.  Mild tricuspid regurgitation.  Normal pericardium.  -Patient stable on admission.  Denies chest pain, shortness of breath, palpitations, syncope, presyncope, dizziness/lightheadedness, loss of consciousness, lower extremity swelling  -Vital signs on admission: HR 140, BP 110/80, RR 18, 97% on RA.  Physical exam unremarkable  PLAN  > Continue diltiazem drip.  Titrate to keep HR 60-110  > Repeat EKG  > Obtain troponin, BNP  > Chest x-ray  > Echocardiogram  > Monitoring on telemetry  > Cardiology consult.  Appreciate recommendations  > Patient's CHA2DS2-VASc score is at least 2 as his age >6 .  Discussed risk-benefit of starting anticoagulation with patient and his second surrogate decision maker.  We will start patient on Eliquis 2.5 mg twice daily  > CBC, CMP, Mg, Phos  > TSH  > Lipid profile  > Hemoglobin A1c      #Gout  > Continue PTA allopurinol      #Benign prostatic hyperplasia  > Continue PTA finasteride      #Sensorineural hearing loss (SNHL) of both ears  - S/p cochlear implants in 11/2020      FEN: IV fluid hydration 1L LR @100ml /hr.  Replace electrolytes as needed  Diet: Cardiac diet  VTE PPx: Eliquis 2.5 BID  Code Status: Full code.  Discussed with the patient and his Producer, television/film/video on 04/29/2021      Nutrition: No Dietitian Consult  Wound: No Wound Consult    Patient seen and discussed with Dr. Karie Mainland.    ________________________________________________________________________________  Primary Care Physician: No Pcp, Na  Verified    Chief Complaint:    History of Present Illness: Robert Woodard is a 81 y.o. male with past medical history  of gout, benign prostatic hyperplasia, sensorineural hearing loss, who was transferred from [facility] for chronic fatigue and found to have new onset atrial flutter.    Patient presented to his PCP clinic with a complaint of feeling more tired than normal.  Patient has history of chronic fatigue, however the last couple days he has been significantly worse.  He had some cold symptoms about 1 week ago which has since resolved.  He was seen in the PCP clinic, and his heart rate was 130s.  The patient was asymptomatic at that time and review of system was negative.  He was instructed to go to an emergency department.    Patient presented to [facility] and he was fairly stable.  Heart rate 130-100 40s.  EKG done revealed atrial flutter and the patient was started on a diltiazem drip with IV fluid hydration.  The patient remained asymptomatic at that time. The patient is stable on this admission.  His Wellsite geologist from the [place], [place], is at bedside and was able to provide most of his medical history.  He reports chronic fatigue that has been quite worse recently.  He denies chest pain, shortness of breath, palpitations, syncope, presyncope, dizziness, lightheadedness, lower extremity edema, nausea, orthopnea, fevers, or chills.  ROS is otherwise negative.     Medical History:   Diagnosis Date   ? Arthritis 11/04/2009   ? Dyspnea    ? GERD (gastroesophageal reflux disease) 11/04/2009   ? Gout 11/04/2009   ? Hyperlipemia 11/04/2009   ? Renal calculi 11/04/2009     Surgical History:   Procedure Laterality Date   ? [id number] - IMPLANTATION COCHLEAR DEVICE Right 03/18/2021    Performed by Paulene Floor, MD at [facility] OR   ? HERNIA REPAIR       Family history reviewed; non-contributory  Social History     Tobacco Use   ? Smoking status: Former     Packs/day: 0.25     Years: 30.00     Pack years: 7.50     Types: Cigarettes, Pipe, Cigars   ? Smokeless tobacco: Never   Vaping Use   ? Vaping Use: Never used   Substance and Sexual Activity   ? Alcohol use: Yes     Comment: 1-2 per week   ? Drug use: No             Immunizations (includes history and patient reported):   Immunization History   Administered Date(s) Administered   ? COVID-19 (PFIZER), mRNA vacc, 30 mcg/0.3 mL (PF) 01/29/2020   ? Pneumococcal Vaccine (23-Val Adult) 04/19/2010   ? Tdap Vaccine 04/19/2010           Allergies:  Patient has no known allergies.    Medications:  Medications Prior to Admission   Medication Sig   ? allopurinol (ZYLOPRIM) 300 mg PO tablet Take 300 mg by mouth daily.   ? Calcium Carbonate 1,000 mg PO Tab Take 1 Tab by mouth daily.   ? cephalexin (KEFLEX) 500 mg capsule Take one capsule by mouth three times daily.   ? ergocalciferol (vitamin D2) (VITAMIN D PO) Take  by mouth.   ? finasteride (PROSCAR) 5 mg tablet    ? fish oil /omega-3 fatty acids (SEA-OMEGA) 340/1000 mg PO capsule Take 1 Cap by mouth three times daily.   ? glucosamine-chondroitin 500-400 mg PO tablet Take 1 Tab by mouth twice daily.   ? methylPREDNIsolone (MEDROL (PAK)) 4 mg tablet Take medication as directed on package  for 6 days. Take with food.   ? traMADoL (ULTRAM) 50 mg tablet Take one tablet by mouth every 8 hours as needed for Pain.   ? vitamins, multiple PO Cap Take 1 Cap by mouth daily.     Review of Systems:  A 14 point review of systems was negative except for: what is documented in the HPI     Physical Exam:  Vital Signs: Last Filed In 24 Hours Vital Signs: 24 Hour Range   BP: 109/87 (01/13 2030)  Temp: 36.5 ?C (97.7 ?F) (01/13 2030)  Pulse: 58 (01/13 2030)  Respirations: 18 PER MINUTE (01/13 2030)  SpO2: 94 % (01/13 2030)  O2 Device: None (Room air) (01/13 2030)  Height: 193 cm (6' 4) (01/13 2023) BP: (109)/(87)   Temp:  [36.5 ?C (97.7 ?F)]   Pulse:  [58]   Respirations:  [18 PER MINUTE]   SpO2:  [94 %]   O2 Device: None (Room air)          Physical Exam  Constitutional:       General: He is not in acute distress.     Appearance: Normal appearance. He is not ill-appearing.   HENT:      Head: Normocephalic and atraumatic.      Mouth/Throat:      Mouth: Mucous membranes are moist.   Eyes:      General: No scleral icterus.  Cardiovascular:      Rate and Rhythm: Tachycardia present. Rhythm irregular.      Heart sounds: No murmur heard.  Pulmonary:      Effort: Pulmonary effort is normal. No respiratory distress.      Breath sounds: Normal breath sounds.   Abdominal:      General: Abdomen is flat. Bowel sounds are normal.      Palpations: Abdomen is soft.   Musculoskeletal:         General: Normal range of motion.      Cervical back: Normal range of motion.      Right lower leg: No edema.   Skin:     General: Skin is warm.      Capillary Refill: Capillary refill takes 2 to 3 seconds.   Neurological:      General: No focal deficit present.      Mental Status: He is alert and oriented to person, place, and time. Mental status is at baseline.         Lab/Radiology/Other Diagnostic Tests:  Hematology:    Lab Results   Component Value Date    HGB 16.9 04/29/2021    HCT 49.2 04/29/2021    PLTCT 288 04/29/2021    WBC 8.2 04/29/2021    NEUT 67 04/29/2021    ANC 5.63 04/29/2021    ALC 1.54 04/29/2021    MONA 10 04/29/2021    AMC 0.81 04/29/2021    ABC 0.04 04/29/2021    MCV 97.1 04/29/2021    MCHC 34.4 04/29/2021    MPV 7.4 04/29/2021    RDW 13.5 04/29/2021   , Coagulation:    Lab Results   Component Value Date    PT 11.7 04/29/2021    PTT 32.1 04/29/2021    INR 1.0 04/29/2021   , General Chemistry:    Lab Results   Component Value Date    NA 140 04/29/2021    K 4.3 04/29/2021    CL 104 04/29/2021    GAP 12 04/29/2021    BUN 24 04/29/2021  CR 1.32 04/29/2021    GLU 158 04/29/2021    CA 9.3 04/29/2021    ALBUMIN 4.3 04/29/2021    MG 2.0 04/29/2021    TOTBILI 1.2 04/29/2021   , Enzymes:    Lab Results   Component Value Date    AST 23 04/29/2021    ALT 21 04/29/2021    ALKPHOS 75 04/29/2021   , Cardiac markers:  No results found for: TNI, CKMB, MYOGLB and Mg and PO4:   Lab Results   Component Value Date    MG 2.0 04/29/2021     Glucose: (!) 158 (04/29/21 2110)  Pertinent radiology reviewed.    Linward Foster, MD   PGY-1, Internal Medicine   Pager

## 2021-05-01 MED ADMIN — DILTIAZEM HCL 180 MG PO CP24 [29272]: 180 mg | ORAL | @ 19:00:00 | NDC 60687020611

## 2021-05-02 ENCOUNTER — Inpatient Hospital Stay: Admit: 2021-05-02 | Discharge: 2021-05-02 | Payer: MEDICARE

## 2021-05-02 MED ADMIN — SODIUM CHLORIDE 0.9 % IV SOLP [27838]: 5 mg/h | INTRAVENOUS | @ 18:00:00 | NDC 00338004938

## 2021-05-02 MED ADMIN — DILTIAZEM HCL 180 MG PO CP24 [29272]: 180 mg | ORAL | @ 15:00:00 | Stop: 2021-05-02 | NDC 60687020611

## 2021-05-02 MED ADMIN — APIXABAN 5 MG PO TAB [315778]: 5 mg | ORAL | @ 02:00:00 | NDC 00003089431

## 2021-05-02 MED ADMIN — DILTIAZEM HCL 5 MG/ML IV SOLN [9869]: 5 mg/h | INTRAVENOUS | @ 18:00:00 | NDC 70860030143

## 2021-05-02 MED ADMIN — APIXABAN 5 MG PO TAB [315778]: 5 mg | ORAL | @ 15:00:00 | NDC 00003089431

## 2021-05-03 ENCOUNTER — Inpatient Hospital Stay: Admit: 2021-05-03 | Discharge: 2021-05-03 | Payer: MEDICARE

## 2021-05-03 MED ORDER — PHENYLEPHRINE HCL IN 0.9% NACL 1 MG/10 ML (100 MCG/ML) IV SYRG
INTRAVENOUS | 0 refills | Status: DC
Start: 2021-05-03 — End: 2021-05-03
  Administered 2021-05-03 (×2): 100 ug via INTRAVENOUS

## 2021-05-03 MED ORDER — PROPOFOL INJ 10 MG/ML IV VIAL
INTRAVENOUS | 0 refills | Status: DC
Start: 2021-05-03 — End: 2021-05-03
  Administered 2021-05-03: 22:00:00 10 mg via INTRAVENOUS
  Administered 2021-05-03: 22:00:00 20 mg via INTRAVENOUS
  Administered 2021-05-03: 22:00:00 50 mg via INTRAVENOUS

## 2021-05-03 MED ORDER — ESMOLOL 100 MG/10 ML (10 MG/ML) IV SOLN
INTRAVENOUS | 0 refills | Status: DC
Start: 2021-05-03 — End: 2021-05-03
  Administered 2021-05-03: 22:00:00 30 mg via INTRAVENOUS

## 2021-05-03 MED ORDER — LIDOCAINE (PF) 20 MG/ML (2 %) IJ SOLN
INTRAVENOUS | 0 refills | Status: DC
Start: 2021-05-03 — End: 2021-05-03
  Administered 2021-05-03: 22:00:00 80 mg via INTRAVENOUS

## 2021-05-03 MED ORDER — PROPOFOL 10 MG/ML IV EMUL 20 ML (INFUSION)(AM)(OR)
INTRAVENOUS | 0 refills | Status: DC
Start: 2021-05-03 — End: 2021-05-03
  Administered 2021-05-03: 22:00:00 120 ug/kg/min via INTRAVENOUS

## 2021-05-03 MED ADMIN — SODIUM CHLORIDE 0.9 % IV SOLP [27838]: 1000.000 mL | INTRAVENOUS | @ 22:00:00 | Stop: 2021-05-04 | NDC 00338004904

## 2021-05-03 MED ADMIN — APIXABAN 5 MG PO TAB [315778]: 5 mg | ORAL | @ 14:00:00 | NDC 00003089431

## 2021-05-03 MED ADMIN — SODIUM CHLORIDE 0.9 % IV SOLP [27838]: 7.5 mg/h | INTRAVENOUS | @ 16:00:00 | NDC 00338952572

## 2021-05-03 MED ADMIN — DILTIAZEM HCL 5 MG/ML IV SOLN [9869]: 7.5 mg/h | INTRAVENOUS | @ 16:00:00 | NDC 17478093725

## 2021-05-03 MED ADMIN — APIXABAN 5 MG PO TAB [315778]: 5 mg | ORAL | @ 03:00:00 | NDC 00003089431

## 2021-05-03 NOTE — Anesthesia Post-Procedure Evaluation
Post-Anesthesia Evaluation    Name: Robert Woodard      MRN: [id number]     DOB: [birth year]     Age: 81 y.o.     Sex: male   __________________________________________________________________________     Procedure Information     Anesthesia Start Date/Time: 05/03/21 1610    Scheduled providers: Geronimo Boot, MD    Procedure: TRANSESOPHAGEAL ECHO    Location: Cardiology:[facility]          Post-Anesthesia Vitals  BP: 99/64 (01/17 1704)  Pulse: 77 (01/17 1704)  Respirations: 20 PER MINUTE (01/17 1704)  SpO2: 93 % (01/17 1704)  O2 Device: None (Room air) (01/17 1704)   Vitals Value Taken Time   BP 99/64 05/03/21 1704   Temp     Pulse 77 05/03/21 1704   Respirations 20 PER MINUTE 05/03/21 1704   SpO2 93 % 05/03/21 1704   O2 Device None (Room air) 05/03/21 1704   ABP     ART BP           Post Anesthesia Evaluation Note    Evaluation location: Pre/Post  Patient participation: recovered; patient participated in evaluation  Level of consciousness: alert    Pain score: 0  Pain management: adequate    Hydration: normovolemia  Temperature: 36.0C - 38.4C  Airway patency: adequate    Perioperative Events       Post-op nausea and vomiting: no PONV    Postoperative Status  Cardiovascular status: hemodynamically stable  Respiratory status: spontaneous ventilation  Additional comments: Patient tolerated MAC well, VSS/  Back in sinus after cardioversion, to floor.        Perioperative Events  There were no known notable events for this encounter.

## 2021-05-04 ENCOUNTER — Encounter: Admit: 2021-05-04 | Discharge: 2021-05-04 | Payer: MEDICARE

## 2021-05-04 ENCOUNTER — Inpatient Hospital Stay: Admit: 2021-05-04 | Discharge: 2021-05-04 | Payer: MEDICARE

## 2021-05-04 MED ADMIN — APIXABAN 5 MG PO TAB [315778]: 5 mg | ORAL | @ 15:00:00 | Stop: 2021-05-04 | NDC 00003089431

## 2021-05-04 MED ADMIN — AMIODARONE 200 MG PO TAB [9066]: 400 mg | ORAL | @ 15:00:00 | Stop: 2021-05-04 | NDC 51672402504

## 2021-05-04 MED ADMIN — APIXABAN 5 MG PO TAB [315778]: 5 mg | ORAL | @ 02:00:00 | NDC 00003089431

## 2021-05-04 MED ADMIN — AMIODARONE 200 MG PO TAB [9066]: 400 mg | ORAL | @ 01:00:00 | Stop: 2021-05-11 | NDC 00904699361

## 2021-05-04 MED FILL — AMIODARONE 200 MG PO TAB: 200 mg | ORAL | 30 days supply | Qty: 58 | Fill #1 | Status: CP

## 2021-05-04 MED FILL — APIXABAN 5 MG PO TAB: 5 mg | ORAL | 90 days supply | Qty: 180 | Fill #1 | Status: CP

## 2021-05-05 ENCOUNTER — Ambulatory Visit: Admit: 2021-05-05 | Discharge: 2021-05-05 | Payer: MEDICARE

## 2021-05-05 ENCOUNTER — Encounter: Admit: 2021-05-05 | Discharge: 2021-05-05 | Payer: MEDICARE

## 2021-05-05 DIAGNOSIS — H903 Sensorineural hearing loss, bilateral: Secondary | ICD-10-CM

## 2021-05-09 ENCOUNTER — Encounter: Admit: 2021-05-09 | Discharge: 2021-05-09 | Payer: MEDICARE

## 2021-06-13 ENCOUNTER — Encounter: Admit: 2021-06-13 | Discharge: 2021-06-13 | Payer: MEDICARE

## 2021-06-13 DIAGNOSIS — N200 Calculus of kidney: Secondary | ICD-10-CM

## 2021-06-13 DIAGNOSIS — E785 Hyperlipidemia, unspecified: Secondary | ICD-10-CM

## 2021-06-13 DIAGNOSIS — K219 Gastro-esophageal reflux disease without esophagitis: Secondary | ICD-10-CM

## 2021-06-13 DIAGNOSIS — R0989 Other specified symptoms and signs involving the circulatory and respiratory systems: Secondary | ICD-10-CM

## 2021-06-13 DIAGNOSIS — M1990 Unspecified osteoarthritis, unspecified site: Secondary | ICD-10-CM

## 2021-06-13 DIAGNOSIS — R0600 Dyspnea, unspecified: Secondary | ICD-10-CM

## 2021-06-13 DIAGNOSIS — M109 Gout, unspecified: Secondary | ICD-10-CM

## 2021-06-13 MED ORDER — APIXABAN 5 MG PO TAB
5 mg | ORAL_TABLET | Freq: Two times a day (BID) | ORAL | 1 refills | Status: AC
Start: 2021-06-13 — End: ?

## 2021-06-13 MED ORDER — AMIODARONE 200 MG PO TAB
200 mg | ORAL_TABLET | Freq: Every day | ORAL | 3 refills | 42.00000 days | Status: AC
Start: 2021-06-13 — End: ?

## 2021-06-13 NOTE — Progress Notes
Date of Service: 06/13/2021    Robert Woodard is a 81 y.o. male.       HPI     At the pleasure of seeing Robert Woodard in our office today for cardiac electrophysiology consultation after his recent hospitalization for atrial flutter.  He was seen by my colleague Dr. Derrell Lolling as inpatient.    As you recall he is a 81 year old delightful gentleman who has known history of hypertension, hyperlipidemia but no other cardiac history, who was in baseline state of health until recently in January 2023 when he presented to the emergency room at [facility] with increased heart rate between 100 to 130 bpm.  He was diagnosed with atrial flutter and transferred to [facility] after initiating IV diltiazem.  He was admitted to [facility] inpatient service on April 29, 2021 initiated on anticoagulation with Eliquis.  He had to wait over the weekend to get TEE guided cardioversion and initiation of amiodarone since there was no immediate room for EP study or ablation.  Subsequently was discharged home for outpatient follow-up with me at the [facility] clinic.    He tells me that he has had symptoms of fatigue for the last 3 years but palpitations are relatively new.  He also has these low energy spells and generally naps seem to help.  He is tolerating amiodarone and anticoagulation well.  He is here to seek long-term options.    He is a former smoker quit long time ago.  No alcohol use.  He has a cochlear implant on the right side which makes it difficult for him to hear and sometimes he has balance issues because of inner ear issues.  Family history is positive for cancer coronary artery disease premature CAD.    Impression and plan:    I reviewed his twelve-lead EKG today which shows sinus rhythm at a rate of 74 bpm PR 226 QRS 94 QTc is 428 ms    Laboratory data showed the Lasix baseline blood work was within normal limits although LDL was slightly high at 130 hemoglobin A1c was 5.8.    I reviewed the EKGs from his admission.  3 initial EKGs showed different presentation of arrhythmias.  First EKG showed what appears to be atrial tachycardia or atypical flutter.  Second EKG showed typical counterclockwise isthmus dependent flutter.  Third EKG showed more rapid, atypical looking almost coarse atrial fibrillation flutter.  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    Echocardiogram showed normal LV function with EF 55% and normal left atrium and right atrium size.    1.  History of typical counterclockwise flutter as well as possible atrial fibrillation and atypical flutter.  2.  Hypertension  3.  Hyperlipidemia  4.  S/p successful TEE cardioversion and amiodarone initiation  5.  Anticoagulation CHADS-VASC score is 3 based on age greater than 22 and hypertension  6.  Cochlear implant and balance issues putting him at increased risk for falls and unsteady gait.  He is obviously a poor candidate for long-term anticoagulation and could benefit from left atrial appendage closure device and I agree with inpatient general cardiology team about consideration of watchman.    We had a long discussion regarding the next course of action.  He is currently happy with the medical management and is tolerating the amiodarone low-dose at 200 mg well.  He is willing to get periodic blood work and yearly  checkups for amiodarone toxicity screening.  We discussed alternative options including typical flutter ablation only versus flutter plus atrial fibrillation ablation versus conservative management.  We also discussed alternatives to anticoagulation including left atrial appendage closure device like watchman device.  There is a possibility that we could bring him in for atrial flutter ablation as well as watchman implantation if he is willing to go for a combined procedure.  He understands the benefits and risks pros and cons of each approach and he wants to think about it and will get back to [place] with final decision.  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    Thank you for letting us participate in the care of your patient.             Vitals:    06/13/21 0924   BP: 138/82   BP Source: Arm, Right Upper   Pulse: 77   SpO2: 96%   O2 Device: None (Room air)   PainSc: Zero   Weight: 99.8 kg (220 lb)   Height: 194.3 cm (6' 4.5)     Body mass index is 26.43 kg/m?Marland Kitchen     Past Medical History  Patient Active Problem List    Diagnosis Date Noted   ? Atrial flutter (HCC) 04/29/2021     04/30/2021 - ECHO:  The left ventricle is normal in size and function, estimated ejection fraction is 55%. There are no regional wall motion abnormalities present though limited visualization of endocardium.  Unable tp assess diastolic function given abnormal underlying rhythm, atrial fib versus atrial flutter.  The right ventricle is poorly seen, grossly normal in size and function.  The left and right atria are normal in size.  There are no significant valvular abnormalities.  Normal estimated PA systolic pressure.   05/03/2021 - TEE + DCCV:  Successful DC cardioversion of atrial flutter to sinus rhythm.   05/04/2021 - EVM:  Overall benign event monitor     ? Cochlear implant in place 04/29/2021   ? BPH (benign prostatic hyperplasia) 04/29/2021   ? Sensorineural hearing loss (SNHL) of both ears 04/01/2021   ? Hyperlipemia 11/04/2009   ? Arthritis 11/04/2009   ? Gout 11/04/2009   ? Renal calculi 11/04/2009   ? GERD (gastroesophageal reflux disease) 11/04/2009     Colonoscopy 2007           Review of Systems   Constitutional: Negative.   HENT: Negative.    Eyes: Negative.    Cardiovascular: Negative.    Respiratory: Negative.    Endocrine: Negative.    Hematologic/Lymphatic: Negative.    Skin: Negative.    Musculoskeletal: Negative.    Gastrointestinal: Negative.    Genitourinary: Negative. Neurological: Negative.    Psychiatric/Behavioral: Negative.    Allergic/Immunologic: Negative.        Physical Exam  Patient is a moderately well-built gentleman who is comfortable at rest,  not in any distress.  Sclerae anicteric.  The oral mucosa is moist and pink.  Neck is  supple without any lymphadenopathy.  Lungs are clear to auscultation bilaterally.  Breath  sounds are normal.  Cardiac exam reveals normal S1, S2 with regular rate and  rhythm.  No murmurs, rubs or gallops noted.  Abdomen:  Soft, nontender, nondistended.  Bowel sounds are present.  Extremities:  No cyanosis, clubbing or edema.  Peripheral  pulses are symmetric.  Skin without any rash. . No gross motor  or neuro deficits.      Cardiovascular Studies      Cardiovascular Health Factors  Vitals BP Readings from Last 3 Encounters:   06/13/21 138/82   05/04/21 137/78   04/01/21 (!) 148/72     Wt Readings from Last 3 Encounters:   06/13/21 99.8 kg (220 lb)   05/03/21 97.5 kg (214 lb 15.2 oz)   04/01/21 97.5 kg (215 lb)     BMI Readings from Last 3 Encounters:   06/13/21 26.43 kg/m?   05/03/21 26.18 kg/m?   04/01/21 26.17 kg/m?      Smoking Social History     Tobacco Use   Smoking Status Former   ? Packs/day: 0.25   ? Years: 30.00   ? Pack years: 7.50   ? Types: Cigarettes, Pipe, Cigars   Smokeless Tobacco Never      Lipid Profile Cholesterol   Date Value Ref Range Status   04/29/2021 187 <200 MG/DL Final     HDL   Date Value Ref Range Status   04/29/2021 30 (L) >40 MG/DL Final     LDL   Date Value Ref Range Status   04/29/2021 130 (H) <100 mg/dL Final     Triglycerides   Date Value Ref Range Status   04/29/2021 225 (H) <150 MG/DL Final      Blood Sugar Hemoglobin A1C   Date Value Ref Range Status   04/29/2021 5.8 4.0 - 6.0 % Final     Comment:     The ADA recommends that most patients with type 1 and type 2 diabetes maintain   an A1c level <7%.       Glucose   Date Value Ref Range Status   05/04/2021 102 (H) 70 - 100 MG/DL Final   16/01/9603 540 (H) 70 - 100 MG/DL Final   98/02/9146 829 (H) 70 - 100 MG/DL Final          Problems Addressed Today  Encounter Diagnoses   Name Primary?   ? Cardiovascular symptoms Yes       Assessment and Plan     Impression and Plan as above.         Current Medications (including today's revisions)  ? acetaminophen (TYLENOL EXTRA STRENGTH) 500 mg tablet Take one tablet by mouth every 4 hours as needed for Pain. Max of 4,000 mg of acetaminophen in 24 hours.   ? amiodarone (CORDARONE) 200 mg tablet Take two tablets by mouth twice daily for 7 days, THEN one tablet twice daily for 7 days, THEN one tablet daily thereafter.   ? apixaban (ELIQUIS) 5 mg tablet Take one tablet by mouth twice daily.   ? ascorbic acid (vitamin C) 1,000 mg tablet Take one tablet by mouth daily.   ? cholecalciferol (vitamin D3) (VITAMIN D3 PO) Take 1 tablet by mouth daily.   ? docosahexaenoic acid/epa (FISH OIL PO) Take 1 capsule by mouth twice daily.   ? glucosamine HCl/chondroitin su (GLUCOSAMINE-CHONDROITIN PO) Take 1 tablet by mouth twice daily.   ? multivit-min-FA-lycopen-lutein (CENTRUM SILVER MEN) 300-600-300 mcg tab Take 1 tablet by mouth daily.

## 2021-06-13 NOTE — Patient Instructions
Dr. Wallene Huh talked to you about a flutter ablation and a watchman.     We could schedule you for these at the same time but it would be a few months out.     Dr. Wallene Huh said you will stay on the Amiodarone for now and let us know what you would like to do.     Follow up in 6 months.

## 2021-07-04 ENCOUNTER — Ambulatory Visit: Admit: 2021-07-04 | Discharge: 2021-07-04 | Payer: MEDICARE

## 2021-07-04 ENCOUNTER — Encounter: Admit: 2021-07-04 | Discharge: 2021-07-04 | Payer: MEDICARE

## 2021-07-04 DIAGNOSIS — H903 Sensorineural hearing loss, bilateral: Secondary | ICD-10-CM

## 2021-07-04 NOTE — Progress Notes
Robert Woodard was seen in the clinic today for 3 month programming of his Advanced Bionics cochlear implant. He reports he is doing well. He definitely notices an improvement in his hearing. He continues to struggle in noise.     Patient History:   Robert Woodard has a history of bilateral progressive SNHL. He has worn hearing aids for several years. His most recent pair was fit a Biochemist, clinical.     Transport planner Internal Device  Initial Stimulation Date  Surgery Date  Surgeon    R Advanced Bionics Mid scalar ultra 3D version 2 04/01/2021 03/18/2021 Paulene Floor, MD      Equipment:   Ear  Processor(s)  Magnet Strength  Processor Condition    R Naida CI M90 4 new     Equipment: new    Incision site: healed  Magnet site: good   Otoscopy: DNT    Programming:   ? Impedances: within normal limits  ? Electrodes: disabled 16    ? Measured M levels to most comfortable-- increase quite a bit today  ? Adjusted low frequency electrodes for comfort with his own voice  ? Backup processor was programmed today    Aided Testing:      Pre-op testing:  Test Binaural Hearing Aids Right Hearing Aid Only Left Hearing Aid Only   CNC Words  26% 30%   CNC Phonemes  46% 50%   AZ Bio Sentences   (in Quiet)  58% 37% 37%   AZ Bio Sentences   (+5 dB SNR) 12%         Testing was completed with a processor on the right/left ear. In the CI only condition, the contralateral ear was plugged.  Test  3 months    Az Bio (quiet) - CI only  30%    Az Bio (quiet) - CI + HA  73%    Az Bio (+5 dB SNR) - CI + HA      CNC (words) - CI only      CNC (phonemes) - CI only      CNC (words) - CI + HA      CNC (phonemes) - CI + HA      Evaluation of Aural Rehabilitation Status: 2:30 pm- 3:00 pm    ? Soundfield: good 30-35 dB    Hearing Aid  Manufacturer: Phonak   Model: Naida M90 link   Serial Numbers: [id number]   Repair Warranty:   L&D Warranty:   Battery: 13  Domes: medium power domes  Slimtube: Size 2    Summary:  Programming was completed to [name]'s satisfaction. He reports more clarity with the adjustments made today. He will start to do some aural rehabilitation exercises with his cochlear implant only. The end of the [facility] lists with CI only tended to get easier as he got more used to the task.     Recommendations:   [name] should return to the clinic as scheduled for re-programming.

## 2021-09-30 ENCOUNTER — Ambulatory Visit: Admit: 2021-09-30 | Discharge: 2021-09-30 | Payer: MEDICARE

## 2021-09-30 ENCOUNTER — Encounter: Admit: 2021-09-30 | Discharge: 2021-09-30 | Payer: MEDICARE

## 2021-12-30 ENCOUNTER — Encounter: Admit: 2021-12-30 | Discharge: 2021-12-30 | Payer: MEDICARE

## 2021-12-30 DIAGNOSIS — Z5181 Encounter for therapeutic drug level monitoring: Secondary | ICD-10-CM

## 2021-12-31 NOTE — Progress Notes
Amiodarone Monitoring 12/30/21:     [facility] Protocol:   Due at baseline and every 6 months:  Labs: ALT, AST, TSH.     Due at baseline and every 12 months: K, Mg, Office Visit, ECG(can use V rate for HR), Chest Imaging (CXR, PFT or high resolution CT), BP and an eye exam.     Amiodarone status:  Amiodarone testing needed: CMP and TSH with Free T4 Reflex and follow-up in 30 days.    Most recent test results (every 6 months: AST, ALT, TSH w Free T4 within normal limits. Every 12 months: Potassium, Magnesium)  Lab Results   Component Value Date/Time    AST 17 05/04/2021 03:28 AM    ALT 18 05/04/2021 03:28 AM    TSH 4.83 04/29/2021 09:10 PM    K 4.2 05/04/2021 03:28 AM    MG 2.0 05/04/2021 03:28 AM       BP:   BP Readings from Last 1 Encounters:   06/13/21 138/82       ECG:   Most recent results for 12-Lead ECG   ECG 12-LEAD    Collection Time: 06/13/21  9:32 AM   Result Value Status    VENTRICULAR RATE 74 Final    P-R INTERVAL 226 Final    QRS DURATION 94 Final    Q-T INTERVAL 386 Final    QTC CALCULATION (BAZETT) 428 Final    P AXIS 49 Final    R AXIS 1 Final    T AXIS 44 Final    Impression    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 Not Applicable    These criteria follow national best practices as defined by [facility] Protocol.

## 2022-01-16 ENCOUNTER — Encounter: Admit: 2022-01-16 | Discharge: 2022-01-16 | Payer: MEDICARE

## 2022-01-16 MED ORDER — APIXABAN 5 MG PO TAB
5 mg | ORAL_TABLET | Freq: Two times a day (BID) | ORAL | 1 refills | Status: AC
Start: 2022-01-16 — End: ?

## 2022-02-13 ENCOUNTER — Encounter: Admit: 2022-02-13 | Discharge: 2022-02-13 | Payer: MEDICARE

## 2022-02-24 ENCOUNTER — Encounter: Admit: 2022-02-24 | Discharge: 2022-02-24 | Payer: MEDICARE

## 2022-02-24 DIAGNOSIS — E785 Hyperlipidemia, unspecified: Secondary | ICD-10-CM

## 2022-02-24 DIAGNOSIS — N200 Calculus of kidney: Secondary | ICD-10-CM

## 2022-02-24 DIAGNOSIS — M1990 Unspecified osteoarthritis, unspecified site: Secondary | ICD-10-CM

## 2022-02-24 DIAGNOSIS — R0600 Dyspnea, unspecified: Secondary | ICD-10-CM

## 2022-02-24 DIAGNOSIS — M109 Gout, unspecified: Secondary | ICD-10-CM

## 2022-02-24 DIAGNOSIS — Z5181 Encounter for therapeutic drug level monitoring: Secondary | ICD-10-CM

## 2022-02-24 DIAGNOSIS — R0989 Other specified symptoms and signs involving the circulatory and respiratory systems: Secondary | ICD-10-CM

## 2022-02-24 DIAGNOSIS — K219 Gastro-esophageal reflux disease without esophagitis: Secondary | ICD-10-CM

## 2022-02-25 ENCOUNTER — Encounter: Admit: 2022-02-25 | Discharge: 2022-02-25 | Payer: MEDICARE

## 2022-02-25 DIAGNOSIS — K219 Gastro-esophageal reflux disease without esophagitis: Secondary | ICD-10-CM

## 2022-02-25 DIAGNOSIS — R0600 Dyspnea, unspecified: Secondary | ICD-10-CM

## 2022-02-25 DIAGNOSIS — M1990 Unspecified osteoarthritis, unspecified site: Secondary | ICD-10-CM

## 2022-02-25 DIAGNOSIS — E785 Hyperlipidemia, unspecified: Secondary | ICD-10-CM

## 2022-02-25 DIAGNOSIS — M109 Gout, unspecified: Secondary | ICD-10-CM

## 2022-02-25 DIAGNOSIS — N200 Calculus of kidney: Secondary | ICD-10-CM

## 2022-02-27 ENCOUNTER — Encounter: Admit: 2022-02-27 | Discharge: 2022-02-27 | Payer: MEDICARE

## 2022-02-27 DIAGNOSIS — Z5181 Encounter for therapeutic drug level monitoring: Secondary | ICD-10-CM

## 2022-02-27 LAB — COMPREHENSIVE METABOLIC PANEL
ALBUMIN: 4.1
ALK PHOSPHATASE: 85
ALT: 28
AST: 27
BLD UREA NITROGEN: 24
CALCIUM: 9.6
CHLORIDE: 108 — ABNORMAL HIGH
CO2: 25
CREATININE: 1.4 — ABNORMAL HIGH
GFR ESTIMATED: 49 — ABNORMAL LOW
GLUCOSE,PANEL: 103
POTASSIUM: 4.5
SODIUM: 141
TOTAL BILIRUBIN: 0.7
TOTAL PROTEIN: 7.6

## 2022-02-27 LAB — TSH WITH FREE T4 REFLEX
FREE T4: 0.9
TSH: 6.2 — ABNORMAL HIGH

## 2022-02-27 NOTE — Progress Notes
Request for the following medical records, for the purpose Continuity of Care.     Please send the following:      Most Recent Labs     Please Fax to:   FAX#: [phone]  Attn: Sherry

## 2022-04-03 ENCOUNTER — Encounter: Admit: 2022-04-03 | Discharge: 2022-04-03 | Payer: MEDICARE

## 2022-04-07 ENCOUNTER — Ambulatory Visit: Admit: 2022-04-07 | Discharge: 2022-04-07 | Payer: MEDICARE

## 2022-04-07 ENCOUNTER — Encounter: Admit: 2022-04-07 | Discharge: 2022-04-07 | Payer: MEDICARE

## 2022-04-07 DIAGNOSIS — H903 Sensorineural hearing loss, bilateral: Secondary | ICD-10-CM

## 2022-04-07 MED ORDER — APIXABAN 5 MG PO TAB
5 mg | ORAL_TABLET | Freq: Two times a day (BID) | ORAL | 0 refills | Status: AC
Start: 2022-04-07 — End: ?

## 2022-04-07 NOTE — Progress Notes
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 He reports he is doing well. He uses his partner microphone in situations that are more difficult but overall he is satisfied. He reports poor battery life from his implant.     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 He has worn hearing aids for several years. His most recent pair was fit a Biochemist, clinical.     Transport planner Internal Device  Initial Stimulation Date  Surgery Date  Surgeon    R Advanced Bionics Mid scalar ultra 3D version 2 04/01/2021 03/18/2021 Paulene Floor, MD      Equipment:   Ear  Processor(s)  Magnet Strength  Processor Condition    R Naida CI M90 4 new     Equipment: new    Incision site: healed  Magnet site: good   Otoscopy: DNT    Programming:   Impedances: within normal limits  Electrodes: 16 is off for sound quality    Adjusted M levels based on sound field   Backup processor was programmed today    Aided Testing:      Pre-op testing:  Test Binaural Hearing Aids Right Hearing Aid Only Left Hearing Aid Only   CNC Words  26% 30%   CNC Phonemes  46% 50%   AZ Bio Sentences   (in Quiet)  58% 37% 37%   AZ Bio Sentences   (+5 dB SNR) 12%         Testing was completed with a processor on the right/left ear. In the CI only condition, the contralateral ear was plugged.  Test  3 months 6 months 1 year   Az Bio (quiet) - CI only  30% 63% 83%   Az Bio (quiet) - CI + HA  73% 66% 81%   Az Bio (+5 dB SNR) - CI + HA   7% 25%   CNC (words) - CI only   56% 56%   CNC (phonemes) - CI only   67% 72%   Evaluation of Aural Rehabilitation Status: 10:30 am- 11:10 am    Soundfield: good 30-35 dB     Hearing Aid  Manufacturer: Phonak   Model: Naida M90 link   Serial Numbers: [id number]   Repair Warranty:   L&D Warranty:   Battery: 13  Domes: medium power domes  Slimtube: Size 2    Summary:  Programming was completed to Mr. Perrelli's satisfaction. Hearing aid was updated to his most recent audiogram He will continue to wear the device full time. Battery estimation is 16+ hours. He reports 7 hours on his current battery. He does have 3 more batteries so we threw away his current battery and he will try his new batteries.     We briefly discussed a second implant. He is not interested at this time. His balance has been off since his implant surgery which could be a concern with the second side.     Recommendations:   Mr. Mullens should return to the clinic as scheduled for re-programming and outcomes.

## 2022-05-08 ENCOUNTER — Encounter: Admit: 2022-05-08 | Discharge: 2022-05-08 | Payer: MEDICARE

## 2022-05-08 NOTE — Telephone Encounter
Attempted to call pt. LVM requesting CB.   Pt amio monitoring, pt due for CXR. Order is in.

## 2022-05-15 ENCOUNTER — Encounter: Admit: 2022-05-15 | Discharge: 2022-05-15 | Payer: MEDICARE

## 2022-05-15 DIAGNOSIS — I483 Typical atrial flutter: Secondary | ICD-10-CM

## 2022-05-15 NOTE — Telephone Encounter
Called patient.  He will get chest xray and lab work done at [facility].   Advised that I would mail orders for both to patient and he can go and get them done where ever he wants.

## 2022-05-15 NOTE — Telephone Encounter
LVM for pt to CB.

## 2022-05-15 NOTE — Telephone Encounter
-----  Message from Thomasene Mohair, RN sent at 03/13/2022  9:26 AM CST -----  Remind patient to have repeat CMP done.  ----- Message -----  From: Lester Kinsman, RN  Sent: 03/12/2022   9:05 AM CST  To: Cvm Nurse Ep Team C      ----- Message -----  From: Theda Sers, MD  Sent: 03/08/2022   9:46 AM CST  To: Lester Kinsman, RN    Yes repeat in 3 months.  Make sure he is not taking any NSAIDs that can affect the kidneys.      ----- Message -----  From: Lester Kinsman, RN  Sent: 02/28/2022  10:49 AM CST  To: Theda Sers, MD    Creatinine 1.46.   Patient is on Eliquis. Do you want a repeat CMP in a couple of months?

## 2022-05-15 NOTE — Telephone Encounter
Robert Meckel, LPN  P Cvm Nurse Ep Team C  VM on triage line from [place] at 3:53pm.  [name] that he is calling to help him schedule chest x-ray.  Call him at [phone].

## 2022-06-14 ENCOUNTER — Encounter: Admit: 2022-06-14 | Discharge: 2022-06-14 | Payer: MEDICARE

## 2022-06-14 DIAGNOSIS — Z5181 Encounter for therapeutic drug level monitoring: Secondary | ICD-10-CM

## 2022-06-14 DIAGNOSIS — I483 Typical atrial flutter: Secondary | ICD-10-CM

## 2022-06-14 NOTE — Progress Notes
Amiodarone Monitoring 06/14/22:     [facility] Protocol:  Due at baseline and every 6 months:  Labs: ALT, AST, TSH.    Due at baseline and every 12 months: K, Mg, Office Visit, ECG(can use V rate for HR), Chest Imaging (CXR, PFT or high resolution CT), BP and an eye exam.     Amiodarone status:  Amiodarone testing needed: CMP and TSH with Free T4 Reflex and follow-up in 20 days.    Most recent test results (every 6 months: AST, ALT, TSH w Free T4 within normal limits. Every 12 months: Potassium, Magnesium)  Lab Results   Component Value Date/Time    AST 27 02/20/2022 12:00 AM    ALT 28 02/20/2022 12:00 AM    TSH 6.21 (H) 02/20/2022 12:00 AM    K 4.5 02/20/2022 12:00 AM    MG 2.0 05/04/2021 03:28 AM       BP:   BP Readings from Last 1 Encounters:   02/24/22 126/84       ECG:   Most recent results for 12-Lead ECG   ECG 12-LEAD    Collection Time: 02/24/22  3:16 PM   Result Value Status    VENTRICULAR RATE 64 Final    P-R INTERVAL 252 Final    QRS DURATION 98 Final    Q-T INTERVAL 416 Final    QTC CALCULATION (BAZETT) 429 Final    P AXIS 33 Final    R AXIS -4 Final    T AXIS 35 Final    Impression    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 Not Applicable    These criteria follow national best practices as defined by [facility] Protocol.

## 2022-06-23 ENCOUNTER — Encounter: Admit: 2022-06-23 | Discharge: 2022-06-23 | Payer: MEDICARE

## 2022-06-23 NOTE — Progress Notes
STAT     Request for the following medical records, for the purpose Continuity of Care.     Please send the following:     Most Recent Chest XRAY     Please Fax to:   FAX#: [phone]  Attn: Judeen Hammans

## 2022-06-28 ENCOUNTER — Encounter: Admit: 2022-06-28 | Discharge: 2022-06-28 | Payer: MEDICARE

## 2022-06-28 DIAGNOSIS — I483 Typical atrial flutter: Secondary | ICD-10-CM

## 2022-06-28 NOTE — Progress Notes
Will also put in RN RF folder and email to medical records.

## 2022-06-30 ENCOUNTER — Encounter: Admit: 2022-06-30 | Discharge: 2022-06-30 | Payer: MEDICARE

## 2022-06-30 MED ORDER — APIXABAN 2.5 MG PO TAB
2.5 mg | ORAL_TABLET | Freq: Two times a day (BID) | ORAL | 3 refills | Status: AC
Start: 2022-06-30 — End: ?

## 2022-06-30 NOTE — Progress Notes
Amiodarone Monitoring 06/30/22:     [facility] Protocol:  Due at baseline and every 6 months:  Labs: ALT, AST, TSH.    Due at baseline and every 12 months: K, Mg, Office Visit, ECG(can use V rate for HR), Chest Imaging (CXR, PFT or high resolution CT), BP and an eye exam.     Amiodarone status:  Amiodarone monitoring completed.  Next amiodarone review is due in 60 days.    Most recent test results (every 6 months: AST, ALT, TSH w Free T4 within normal limits. Every 12 months: Potassium, Magnesium)  Lab Results   Component Value Date/Time    AST 22 06/27/2022 12:00 AM    ALT 25 06/27/2022 12:00 AM    TSH 6.21 (H) 02/20/2022 12:00 AM    K 4.4 06/27/2022 12:00 AM    MG 2.0 05/04/2021 03:28 AM       BP:   BP Readings from Last 1 Encounters:   02/24/22 126/84       ECG:   Most recent results for 12-Lead ECG   ECG 12-LEAD    Collection Time: 02/24/22  3:16 PM   Result Value Status    VENTRICULAR RATE 64 Final    P-R INTERVAL 252 Final    QRS DURATION 98 Final    Q-T INTERVAL 416 Final    QTC CALCULATION (BAZETT) 429 Final    P AXIS 33 Final    R AXIS -4 Final    T AXIS 35 Final    Impression    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 Yes    These criteria follow national best practices as defined by [facility] Protocol.

## 2022-06-30 NOTE — Telephone Encounter
Spoke with pt regarding below. He is agreeable. Rx sent to preferred pharmacy.

## 2022-06-30 NOTE — Telephone Encounter
-----   Message from Lester Kinsman, RN sent at 06/30/2022  4:07 PM CDT -----    ----- Message -----  From: Theda Sers, MD  Sent: 06/28/2022   7:30 PM CDT  To: Lester Kinsman, RN    Yes please.    Also decrease the Eliquis dose to 2.5 mg twice a day since creatinine is above 1.5.    ----- Message -----  From: Lester Kinsman, RN  Sent: 06/28/2022  12:18 PM CDT  To: Theda Sers, MD    Labs for [facility] monitoring.     Patient's creatinine has bumped up to 1.87. Do you want me to forward labs to patient's PCP?

## 2022-08-08 ENCOUNTER — Encounter: Admit: 2022-08-08 | Discharge: 2022-08-08 | Payer: MEDICARE

## 2022-08-08 MED ORDER — AMIODARONE 200 MG PO TAB
200 mg | ORAL_TABLET | Freq: Every day | ORAL | 0 refills | 42.00000 days | Status: AC
Start: 2022-08-08 — End: ?

## 2022-10-13 ENCOUNTER — Encounter: Admit: 2022-10-13 | Discharge: 2022-10-13 | Payer: MEDICARE

## 2022-10-23 ENCOUNTER — Encounter: Admit: 2022-10-23 | Discharge: 2022-10-23 | Payer: MEDICARE

## 2022-10-23 DIAGNOSIS — Z5181 Encounter for therapeutic drug level monitoring: Secondary | ICD-10-CM

## 2022-10-23 NOTE — Progress Notes
Amiodarone Monitoring 10/23/22:     [facility] Protocol:  Due at baseline and every 6 months:  Labs: ALT, AST, TSH.    Due at baseline and every 12 months: K, Mg, Office Visit, ECG(can use V rate for HR), Chest Imaging (CXR, PFT or high resolution CT), BP and an eye exam.     Amiodarone status:  Amiodarone testing needed: TSH with Free T4 Reflex and CXR and follow-up in 30 days.    Most recent test results (every 6 months: AST, ALT, TSH w Free T4 within normal limits. Every 12 months: Potassium, Magnesium)  Lab Results   Component Value Date/Time    AST 22 06/27/2022 12:00 AM    ALT 25 06/27/2022 12:00 AM    TSH 6.21 (H) 02/20/2022 12:00 AM    K 4.4 06/27/2022 12:00 AM    MG 2.0 05/04/2021 03:28 AM       BP:   BP Readings from Last 1 Encounters:   02/24/22 126/84       ECG:   Most recent results for 12-Lead ECG   ECG 12-LEAD    Collection Time: 02/24/22  3:16 PM   Result Value Status    VENTRICULAR RATE 64 Final    P-R INTERVAL 252 Final    QRS DURATION 98 Final    Q-T INTERVAL 416 Final    QTC CALCULATION (BAZETT) 429 Final    P AXIS 33 Final    R AXIS -4 Final    T AXIS 35 Final    Impression    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 Not Applicable

## 2022-11-06 ENCOUNTER — Encounter: Admit: 2022-11-06 | Discharge: 2022-11-06 | Payer: MEDICARE

## 2022-11-06 MED ORDER — AMIODARONE 200 MG PO TAB
ORAL_TABLET | 0 refills
Start: 2022-11-06 — End: ?

## 2022-12-16 ENCOUNTER — Encounter: Admit: 2022-12-16 | Discharge: 2022-12-16 | Payer: MEDICARE

## 2022-12-17 NOTE — Progress Notes
Amiodarone Monitoring 12/16/22:     [facility] Protocol:  Due at baseline and every 6 months:  Labs: ALT, AST, TSH.    Due at baseline and every 12 months: K, Mg, Office Visit, ECG(can use V rate for HR), Chest Imaging (CXR, PFT or high resolution CT), BP and an eye exam.     Amiodarone status:  Amiodarone testing needed: CMP, TSH with Free T4 Reflex, and CXR and follow-up in 180 days.    Most recent test results (every 6 months: AST, ALT, TSH w Free T4 within normal limits. Every 12 months: Potassium, Magnesium)  Lab Results   Component Value Date/Time    AST 22 06/27/2022 12:00 AM    ALT 25 06/27/2022 12:00 AM    TSH 6.21 (H) 02/20/2022 12:00 AM    K 4.4 06/27/2022 12:00 AM    MG 2.0 05/04/2021 03:28 AM       BP:   BP Readings from Last 1 Encounters:   02/24/22 126/84       ECG:   Most recent results for 12-Lead ECG   ECG 12-LEAD    Collection Time: 02/24/22  3:16 PM   Result Value Status    VENTRICULAR RATE 64 Final    P-R INTERVAL 252 Final    QRS DURATION 98 Final    Q-T INTERVAL 416 Final    QTC CALCULATION (BAZETT) 429 Final    P AXIS 33 Final    R AXIS -4 Final    T AXIS 35 Final    Impression    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 Not Applicable

## 2023-02-05 ENCOUNTER — Encounter: Admit: 2023-02-05 | Discharge: 2023-02-05 | Payer: MEDICARE

## 2023-02-05 MED ORDER — AMIODARONE 200 MG PO TAB
200 mg | ORAL_TABLET | Freq: Every day | ORAL | 0 refills | 42.00000 days | Status: AC
Start: 2023-02-05 — End: ?

## 2023-02-07 ENCOUNTER — Encounter: Admit: 2023-02-07 | Discharge: 2023-02-07 | Payer: MEDICARE

## 2023-02-07 DIAGNOSIS — Z5181 Encounter for therapeutic drug level monitoring: Secondary | ICD-10-CM

## 2023-02-07 NOTE — Telephone Encounter
-----   Message from [place] R sent at 02/07/2023  2:45 PM CDT -----  Regarding: FW: Amiodarone monitoring labs / labs ordered per RAD    ----- Message -----  From: Alfredia Client, LPN  Sent: 60/45/4098   2:22 PM CDT  To: Cvm Nurse Ep  Subject: Amiodarone monitoring labs / labs ordered pe#    Pt has an open lab order to complete TSH with Free T4 Reflex ordered per RAD in February 2024 and July 2024. Neither orders have been completed. These labs were ordered to monitor his medication Amiodarone. Pt's last TSH is from 02/20/2022. Pt has upcoming appt with Sharman Cheek APRN in [facility] on 02/14/2023. Can you call pt and request he have lab drawn before upcoming appt?   Thank you!!

## 2023-02-07 NOTE — Telephone Encounter
RC to pt. Discused need for updated blood draw. Pt will complete this at PCP office. WIll fax orders to PCP office at #[phone]. Also sent CXR orders to PCP. Reviewed plan with the patient. Patient verbalized understanding and does not have any further questions or concerns. No further education requested from patient. Patient has our contact information for future needs.

## 2023-02-09 ENCOUNTER — Encounter: Admit: 2023-02-09 | Discharge: 2023-02-09 | Payer: MEDICARE

## 2023-02-09 DIAGNOSIS — Z5181 Encounter for therapeutic drug level monitoring: Secondary | ICD-10-CM

## 2023-02-09 LAB — TSH WITH FREE T4 REFLEX
FREE T4: 1.1 (ref 0.7–1.48)
TSH: 7.2 — ABNORMAL HIGH (ref 0.35–4.94)

## 2023-02-14 ENCOUNTER — Encounter: Admit: 2023-02-14 | Discharge: 2023-02-14 | Payer: MEDICARE

## 2023-02-14 ENCOUNTER — Ambulatory Visit: Admit: 2023-02-14 | Discharge: 2023-02-14 | Payer: MEDICARE

## 2023-02-14 DIAGNOSIS — I4892 Unspecified atrial flutter: Secondary | ICD-10-CM

## 2023-02-14 DIAGNOSIS — R0989 Other specified symptoms and signs involving the circulatory and respiratory systems: Secondary | ICD-10-CM

## 2023-02-14 DIAGNOSIS — E785 Hyperlipidemia, unspecified: Secondary | ICD-10-CM

## 2023-02-14 MED ORDER — AMIODARONE 100 MG PO TAB
100 mg | ORAL_TABLET | Freq: Every day | ORAL | 3 refills | 42.00000 days | Status: AC
Start: 2023-02-14 — End: ?

## 2023-04-09 ENCOUNTER — Ambulatory Visit: Admit: 2023-04-09 | Discharge: 2023-04-10 | Payer: MEDICARE

## 2023-04-09 ENCOUNTER — Encounter: Admit: 2023-04-09 | Discharge: 2023-04-09 | Payer: MEDICARE

## 2023-04-09 DIAGNOSIS — H903 Sensorineural hearing loss, bilateral: Secondary | ICD-10-CM

## 2023-04-09 NOTE — Progress Notes
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 He reports he is doing well with one on one and small groups at a short distant. He struggles in noise but finds his partner microphone is very helpful.     Patient History:   [name] has a history of bilateral progressive SNHL. He has worn hearing aids for several years. His most recent pair was fit a Biochemist, clinical.     Transport planner Internal Device  Initial Stimulation Date  Surgery Date  Surgeon    R Advanced Bionics Mid scalar ultra 3D version 2 04/01/2021 03/18/2021 Paulene Floor, MD      Equipment:   Ear  Processor(s)  Magnet Strength  Processor Condition    R Naida CI M90 4 new     Equipment: new    Incision site: healed  Magnet site: good   Otoscopy: DNT    Programming:   Impedances: within normal limits  Electrodes: 16 is off for sound quality    Data Logging: 8 hours  Did not make any programming adjustments today    Aided Testing:      Pre-op testing:  Test Binaural Hearing Aids Right Hearing Aid Only Left Hearing Aid Only   CNC Words  26% 30%   CNC Phonemes  46% 50%   AZ Bio Sentences   (in Quiet)  58% 37% 37%   AZ Bio Sentences   (+5 dB SNR) 12%         Testing was completed with a processor on the right/left ear. In the CI only condition, the contralateral ear was plugged.  Test  3 months 6 months 1 year 2 years   Az Bio (quiet) - CI only  30% 63% 83% 88%   Az Bio (quiet) - CI + HA  73% 66% 81% 83%   Az Bio (+5 dB SNR) - CI + HA   7% 25% 4%   CNC (words) - CI only   56% 56% 66%   CNC (phonemes) - CI only   67% 72% 81%   Evaluation of Aural Rehabilitation Status: 35 minutes    Soundfield: good 30-35 dB  Hearing preservation: stable   Audiogram: left ear tested today- no changes since 03/2022     Hearing Aid  Manufacturer: Phonak   Model: Naida M90 link   Serial Numbers: [id number]   Repair Warranty: 06/12/2024  L&D Warranty: 06/12/2024  Battery: 13  Domes: medium power domes  Slimtube: Size 2    Summary:  Programming was completed to [name]'s satisfaction.  He will continue to wear the device full time. He would Woodard a second partner microphone. He will order one through [employer].      We briefly discussed a second implant. He is not interested at this time. His balance has been off since his implant surgery which could be a concern with the second side.     Recommendations:   [name] should return to the clinic as scheduled for re-programming and outcomes.

## 2023-06-17 ENCOUNTER — Encounter: Admit: 2023-06-17 | Discharge: 2023-06-17 | Payer: MEDICARE

## 2023-06-17 MED ORDER — ELIQUIS 2.5 MG PO TAB
2.5 mg | ORAL_TABLET | Freq: Two times a day (BID) | ORAL | 3 refills | Status: AC
Start: 2023-06-17 — End: ?

## 2023-08-09 ENCOUNTER — Encounter: Admit: 2023-08-09 | Discharge: 2023-08-09 | Payer: MEDICARE

## 2023-08-17 ENCOUNTER — Encounter: Admit: 2023-08-17 | Discharge: 2023-08-17 | Payer: MEDICARE

## 2023-08-17 ENCOUNTER — Ambulatory Visit: Admit: 2023-08-17 | Discharge: 2023-08-18 | Payer: MEDICARE

## 2023-08-17 DIAGNOSIS — R0989 Other specified symptoms and signs involving the circulatory and respiratory systems: Secondary | ICD-10-CM

## 2023-08-17 DIAGNOSIS — I4892 Unspecified atrial flutter: Secondary | ICD-10-CM

## 2023-08-17 NOTE — Progress Notes
 Date of Service: 08/17/2023    Robert Woodard  is a 83 y.o. male     Referred by:     HPI    Subjective         The patient is an 83 year old with atrial flutter who presents for a cardiac electrophysiology follow-up.    He has a history of atrial flutter and underwent ablation in January 2023. Despite the procedure, he experienced recurrent arrhythmias requiring amiodarone therapy, cardioversion, and anticoagulation. He is currently on a low dose of amiodarone and Eliquis with no recent episodes of arrhythmia.    He experiences fatigue, particularly after walking about three blocks, but has no shortness of breath. He attributes the fatigue to aging, as he was previously a runner [name]. He takes three to four power naps daily and feels that his sleep is interrupted by nocturia.    He has a history of mild subclinical hypothyroidism, with TSH levels slightly elevated since starting amiodarone, while T4 levels remain normal. He undergoes regular blood tests every six months, with the most recent scheduled for tomorrow. His kidney function has been slightly impaired, which may contribute to his fatigue.    He takes finasteride for prostate enlargement and sees a urologist every six months. He denies any history of urinary obstruction. He experiences nocturia three times a night but has not been diagnosed with sleep apnea.     Past Medical History  - History of hypertension  - History of diabetes  - History of atrial fibrillation  - History of prostate enlargement           VITALS: P- 70  CHEST: Clear to auscultation bilaterally, no wheezes, rhonchi, or crackles.  CARDIOVASCULAR: Normal heart rate and rhythm, S1 and S2 normal without murmurs.  EXTREMITIES: No cyanosis or edema.            Assessment and Plan    Problems Addressed Today  Encounter Diagnoses   Name Primary?    Cardiovascular symptoms Yes    Atrial flutter, unspecified type (CMS-HCC)             LABS  TSH: 7.2 (2024)  T4: Normal (2024)  Hb: Normal (2024)  Na: Normal (2024)  K: Normal (2024)  Cr: Abnormal (2024)    DIAGNOSTIC  EKG: Normal sinus rhythm at 70 bpm (08/17/2023)  Sleep study: Normal               Atrial flutter with recurrent arrhythmia  Atrial flutter with recurrent arrhythmia, status post cardioversion and ongoing amiodarone therapy. No recent arrhythmia episodes. EKG shows normal rhythm at 70 bpm. Discussed potential for redo ablation if thyroid function becomes concerning due to amiodarone. Ablation is a minor procedure with a short recovery time, typically requiring six hours of bed rest and possibly an overnight stay.  - Continue low dose amiodarone.  - Monitor thyroid function with upcoming lab work.  - Consider redo ablation if thyroid function becomes concerning.    Subclinical hypothyroidism  Subclinical hypothyroidism with elevated TSH but normal T4, likely related to amiodarone. No current need for thyroid medication unless T4 becomes low.  - Monitor thyroid function with upcoming lab work.  - Consider thyroid medication if T4 becomes low.    Hypertension  Hypertension is well-controlled with blood pressure within normal limits.    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 No medications affecting kidney function.    Benign prostatic hyperplasia  Benign  prostatic hyperplasia managed with finasteride. No obstruction. Frequent nocturia noted, possibly affecting sleep quality.  - Continue finasteride.  - Discuss potential bladder-expanding medications with urologist at next visit.           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 Please feel free to contact us  if you have any questions or concerns.           Vitals:    08/17/23 1250   BP: 129/81   BP Source: Arm, Left Upper   Pulse: 73   SpO2: 95%   O2 Device: None (Room air)   PainSc: Zero   Weight: 99.8 kg (220 lb)   Height: 194.3 cm (6' 4.5)      Body mass index is 26.43 kg/m?Robert Woodard     Past Medical History         Arthritis  Atrial fibrillation (CMS-HCC)  Dyspnea  GERD (gastroesophageal reflux disease)  Gout  Hyperlipemia  Renal calculi     Review of Systems   Constitutional: Positive for malaise/fatigue.   HENT: Negative.     Eyes: Negative.    Cardiovascular: Negative.    Respiratory: Negative.     Endocrine: Negative.    Hematologic/Lymphatic: Negative.    Skin: Negative.    Musculoskeletal: Negative.    Gastrointestinal: Negative.    Genitourinary: Negative.    Neurological:  Positive for excessive daytime sleepiness.   Psychiatric/Behavioral: Negative.     Allergic/Immunologic: Negative.         Physical Exam     Patient is a moderately well-built gentleman who is comfortable at rest,  not in any distress.  Sclerae anicteric.  The oral mucosa is moist and pink.  Neck is  supple without any lymphadenopathy.  Lungs are clear to auscultation bilaterally.  Breath  sounds are normal.  Cardiac exam reveals normal S1, S2 with regular rate and  rhythm.  No murmurs, rubs or gallops noted.  Abdomen:  Soft, nontender, nondistended.  Bowel sounds are present.  Extremities:  No cyanosis, clubbing or edema.  Peripheral  pulses are symmetric.  Skin without any rash. . No gross motor or neuro deficits.      Cardiovascular Studies           Cardiovascular Health Factors  Vitals BP Readings from Last 3 Encounters:   08/17/23 129/81   02/14/23 (!) 143/86   02/24/22 126/84     Wt Readings from Last 3 Encounters:   08/17/23 99.8 kg (220 lb)   02/14/23 101.8 kg (224 lb 6.4 oz)   02/24/22 103.6 kg (228 lb 6.4 oz)     BMI Readings from Last 3 Encounters:   08/17/23  26.43 kg/m?   02/14/23 26.96 kg/m?   02/24/22 27.44 kg/m?      Smoking Social History     Tobacco Use   Smoking Status Former    Current packs/day: 0.00    Average packs/day: 0.3 packs/day for 30.0 years (7.5 ttl pk-yrs)    Types: Cigarettes, Pipe, Cigars    Quit date: 04/17/1988    Years since quitting: 35.3   Smokeless Tobacco Never      Lipid Profile Cholesterol   Date Value Ref Range Status   04/29/2021 187 <200 MG/DL Final     HDL   Date Value Ref Range Status   04/29/2021 30 (L) >40 MG/DL Final     LDL   Date Value Ref Range Status   04/29/2021 130 (H) <100 mg/dL Final     Triglycerides   Date Value Ref Range Status   04/29/2021 225 (H) <150 MG/DL Final      Blood Sugar Hemoglobin A1C   Date Value Ref Range Status   04/29/2021 5.8 4.0 - 6.0 % Final     Comment:     The ADA recommends that most patients with type 1 and type 2 diabetes maintain   an A1c level <7%.       Glucose   Date Value Ref Range Status   02/09/2023 102  Final   09/20/2022 102  Final   09/18/2022 87  Final          Current Medications (including today's revisions)   acetaminophen (TYLENOL EXTRA STRENGTH) 500 mg tablet Take one tablet by mouth every 4 hours as needed for Pain. Max of 4,000 mg of acetaminophen in 24 hours.    allopurinoL (ZYLOPRIM) 100 mg tablet Take one tablet by mouth daily.    amiodarone (PACERONE) 100 mg tablet Take one tablet by mouth daily. Take with food.    ascorbic acid (vitamin C) 1,000 mg tablet Take one tablet by mouth daily.    calcium carbonate (OS-CAL) 1250 mg (500 mg elemental calcium) tablet Take one tablet by mouth daily.    cholecalciferol (vitamin D3) (VITAMIN D3 PO) Take 1 tablet by mouth daily.    docosahexaenoic acid/epa (FISH OIL PO) Take 1 capsule by mouth twice daily.    ELIQUIS 2.5 mg tablet TAKE ONE TABLET BY MOUTH TWICE DAILY    finasteride (PROSCAR) 5 mg tablet Take one tablet by mouth daily.    glucosamine HCl/chondroitin su (GLUCOSAMINE-CHONDROITIN PO) Take 1 tablet by mouth twice daily.    multivit-min-FA-lycopen-lutein (CENTRUM SILVER MEN) 300-600-300 mcg tab Take 1 tablet by mouth daily.

## 2023-08-17 NOTE — Patient Instructions
 Please have your lab results sent to us  once complete. If your thyroid levels are abnormal then we may have to stop amiodarone.     If they are okay then we will see you in 1 year.         Follow-Up:    -Thank you for allowing me to take care of you today. My name is Elsworth Halt, Charity fundraiser.    -We would like you to follow up in   1 years with Dr. Quincy Buba Dendi   The schedule is released approximately 4-5 months in advance. You should be called or mailed to make an appointment, however if you would like to call us  to make this appt, please call [phone].    -You will receive a survey in the upcoming week from [facility]. Your feedback is important to us , and helps us  continue to improve patient care and patient satisfaction.       Contacting our office:    -Business Hours: Monday-Friday, 8:00 am-4:30 pm (excluding Holidays).     -For medical questions or concerns, please send us  a message through your MyChart account or call the [facility] nursing triage line at [phone]. Please leave a detailed message with your name, date of birth, and reason for your call.  If your message is received before 3:30pm, every effort will be made to call you back the same day.  Please allow time for us  to review your chart prior to call back.     -For medication refills please start by contacting your pharmacy. You can also send us  a prescription question through your MyChart or call the nurse triage line above.     -Our fax number is [phone].    Results & Testing Follow Up:    -Please allow 10-15 business days for the results of any testing to be reviewed. Please call our office if you have not heard from a nurse within this time frame.    -Should you choose to complete testing at an outside facility, please contact our office after completion of testing so that we can ensure that we have received results.    Lab and test results:  As a part of the CARES act, starting 07/17/2019, some results will be released to you via mychart immediately and automatically.  You may see results before your provider sees them; however, your provider will review all these results and then they, or one of their team, will notify you of result information and recommendations.   Critical results will be addressed immediately, but otherwise, please allow us  time to get back with you prior to you reaching out to us  for questions.  This will usually take about 72 hours for labs and 5-7 days for procedure test results.      We know you have a choice and want to thank you for choosing [facility].

## 2023-08-23 ENCOUNTER — Encounter: Admit: 2023-08-23 | Discharge: 2023-08-23 | Payer: MEDICARE

## 2023-08-23 NOTE — Telephone Encounter
 Placed return call to patient, patient reports he got his labs drawn Monday at [facility] in [place].     We will request records and follow up as such.

## 2023-08-23 NOTE — Telephone Encounter
-----   Message from [place] R sent at 08/17/2023  2:06 PM CDT -----  Regarding: RAD- amio labs  Per RAD, patient is having amio labs drawn at outside lab on 5/3. We need to get results if we haven't already. He said if TSH is abnormal then we may have to stop amiodarone and consider an ablation. If normal, follow up in one year which I have already ordered.Thanks!

## 2023-08-23 NOTE — Progress Notes
 Request for the following medical records for purpose of continuity of care:     [facility] [place], [place])  F/ [phone]    Please fax lab results collected May 2025 for Dr. Clora Dane to review for medication management.    Please Fax to: [phone]  Cardiology Services at the [facility]   Dr. Clora Dane  Attention:  Evalyn Hillier, RN    Thank you

## 2023-09-13 ENCOUNTER — Encounter: Admit: 2023-09-13 | Discharge: 2023-09-13 | Payer: MEDICARE

## 2023-09-13 DIAGNOSIS — Z5181 Encounter for therapeutic drug level monitoring: Secondary | ICD-10-CM

## 2023-09-13 NOTE — Progress Notes
 Amiodarone Monitoring 09/13/23:     [facility] Protocol:  Due at baseline and every 6 months:  Labs: ALT, AST, TSH.    Due at baseline and every 12 months: K, Mg, Office Visit, ECG(can use V rate for HR), Chest Imaging (CXR, PFT or high resolution CT), BP and an eye exam.     Amiodarone status:  Amiodarone testing needed: Magnesium, CXR, and Eye Exam and follow-up in 30 days.    Most recent test results (every 6 months: AST, ALT, TSH w Free T4 within normal limits. Every 12 months: Potassium, Magnesium)  Lab Results   Component Value Date/Time    AST 26 08/18/2023 12:00 AM    ALT 25 08/18/2023 12:00 AM    TSH 6.61 (H) 08/18/2023 12:00 AM    FREET4R 0.98 08/18/2023 12:00 AM    K 4.6 08/18/2023 12:00 AM    MG 2.1 09/19/2022 12:00 AM       BP:   BP Readings from Last 1 Encounters:   08/17/23 129/81       ECG:   Most recent results for 12-Lead ECG   ECG 12-LEAD    Collection Time: 08/17/23  1:01 PM   Result Value Status    VENTRICULAR RATE 70 Final    P-R INTERVAL 244 Final    QRS DURATION 94 Final    Q-T INTERVAL 412 Final    QTC CALCULATION (BAZETT) 444 Final    P AXIS 37 Final    R AXIS -6 Final    T AXIS 33 Final    Impression    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 Not applicable

## 2023-11-28 IMAGING — CR [ID]
1 series · 1 of 1 positions shown · non-contrast
Comparison: none

[x chest ap]
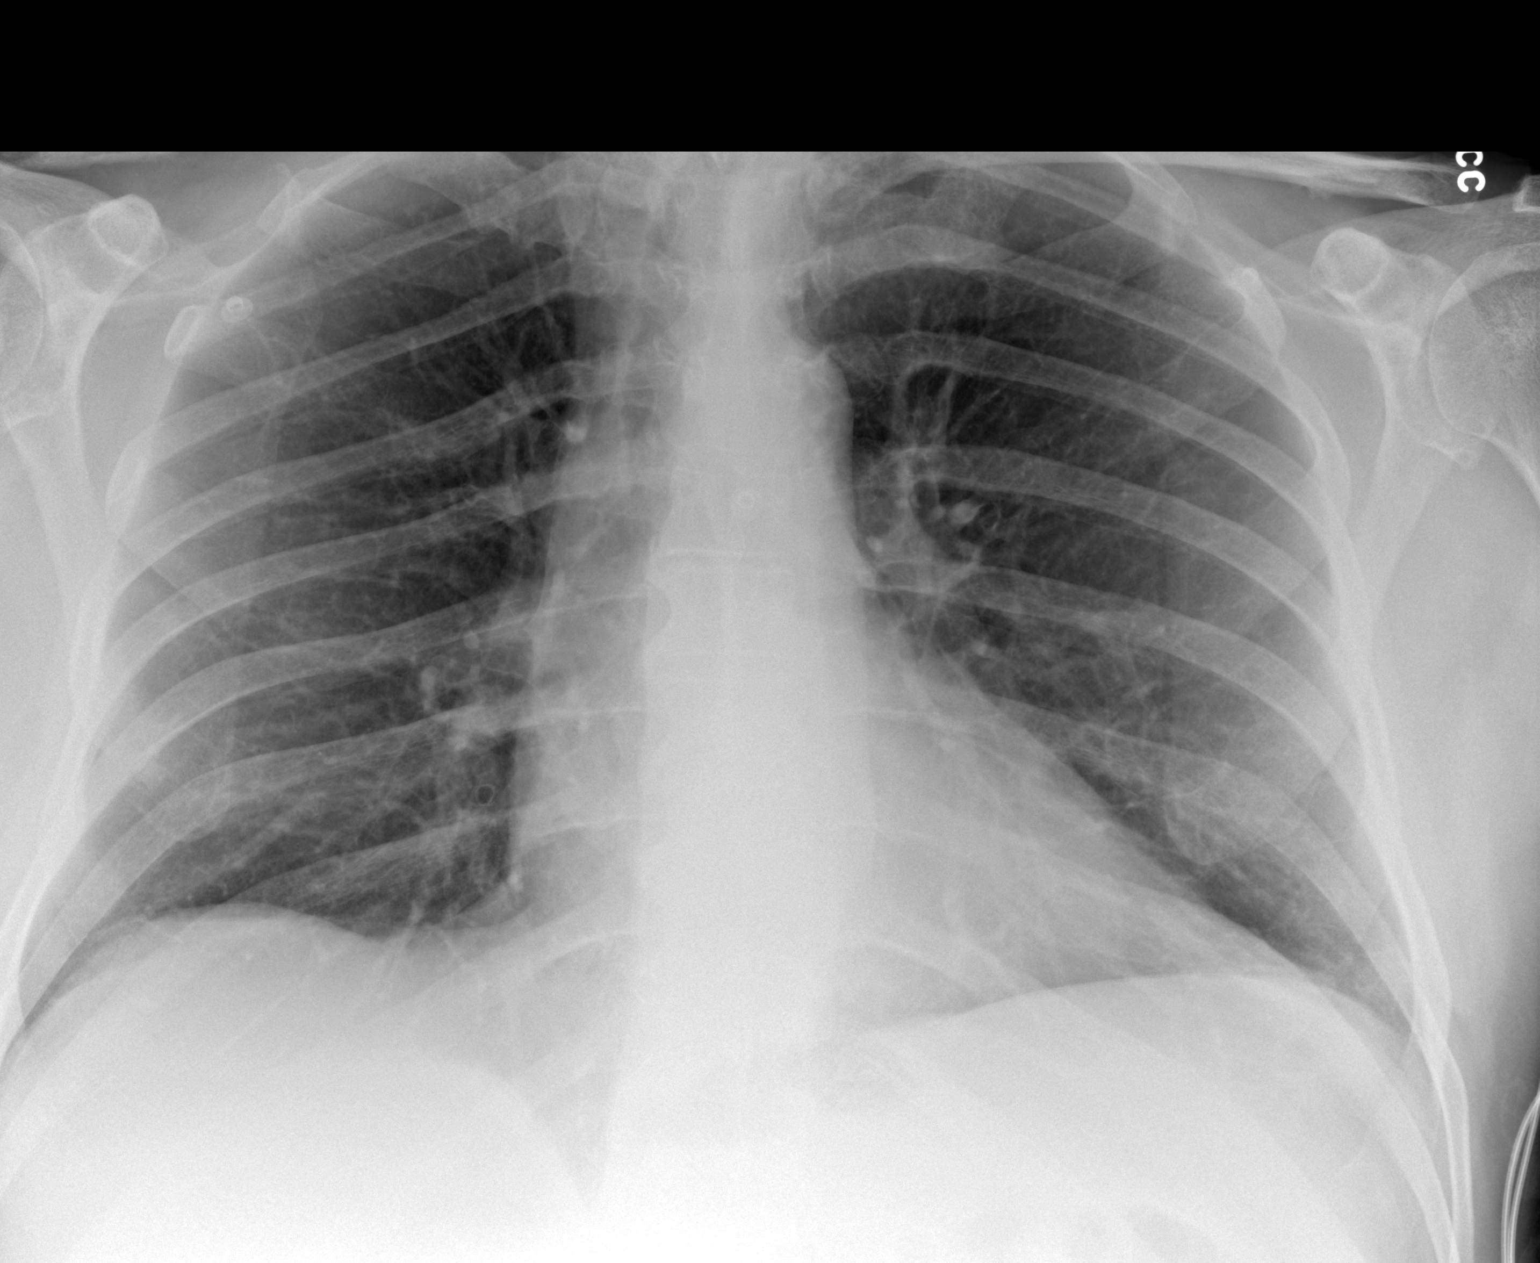

[1 of 1 positions shown; findings below may reference images not displayed]

EXAM

RADIOLOGICAL EXAMINATION, CHEST; SINGLE VIEW, FRONTAL CPT 47171

INDICATION

Fatigue and rapid HR

TECHNIQUE

1 view of the chest was acquired.

COMPARISONS

Previous examination dated 06/12/2019.

FINDINGS

The cardiac silhouette is within normal limits. Lungs are clear. Pulmonary vasculature is normal in
caliber.

The costophrenic angles are sharp. The bony structures are adequately mineralized.

IMPRESSION

No acute cardiopulmonary process.

Tech Notes:

Fatigue and rapid HR

## 2024-02-01 ENCOUNTER — Encounter: Admit: 2024-02-01 | Discharge: 2024-02-01 | Payer: MEDICARE

## 2024-02-01 MED ORDER — AMIODARONE 100 MG PO TAB
100 mg | ORAL_TABLET | Freq: Every day | ORAL | 3 refills | 42.00000 days | Status: AC
Start: 2024-02-01 — End: ?

## 2024-02-05 ENCOUNTER — Encounter: Admit: 2024-02-05 | Discharge: 2024-02-05 | Payer: MEDICARE

## 2024-02-05 NOTE — Telephone Encounter
 RC to [place]. Discussed pt had labs and CXR completed in June. Bernida will send results to secure fax at this time.

## 2024-02-05 NOTE — Telephone Encounter
 Placed return call to [place], LVM for CB.     Last labs and CXR I see was for amiodarone  monitoring. Appears It was back in may. Mag lab level ordered but not completed yet.

## 2024-02-05 NOTE — Telephone Encounter
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 Requesting a call back to discus Father Quebedeaux lab work and chest xray.   Bernida can be reached @ [phone]

## 2024-02-19 ENCOUNTER — Encounter: Admit: 2024-02-19 | Discharge: 2024-02-19 | Payer: MEDICARE

## 2024-02-19 LAB — ALT (SGPT): ALT: 22

## 2024-02-19 LAB — AST (SGOT): AST: 27

## 2024-02-19 LAB — TSH WITH FREE T4 REFLEX: TSH: 2

## 2024-02-26 ENCOUNTER — Encounter: Admit: 2024-02-26 | Discharge: 2024-02-26 | Payer: MEDICARE

## 2024-02-26 DIAGNOSIS — Z79899 Other long term (current) drug therapy: Principal | ICD-10-CM

## 2024-02-26 NOTE — Progress Notes [1]
 Amiodarone  Monitoring 02/26/24:     [facility] Protocol:  Due at baseline and every 6 months:  Labs: ALT, AST, TSH.    Due at baseline and every 12 months: K, Mg, Office Visit, ECG(can use V rate for HR), Chest Imaging (CXR, PFT or high resolution CT), BP and an eye exam.     Amiodarone  status:  Amiodarone  testing needed: Magnesium, AST, ALT, TSH with Free T4 Reflex, and Eye Exam and follow-up in 30 days.    Most recent test results (every 6 months: AST, ALT, TSH w Free T4 within normal limits. Every 12 months: Potassium, Magnesium)  Lab Results   Component Value Date/Time    AST 26 09/20/2023 12:00 AM    ALT 25 08/18/2023 12:00 AM    TSH 6.61 (H) 08/18/2023 12:00 AM    FREET4R 0.98 08/18/2023 12:00 AM    K 4.6 08/18/2023 12:00 AM    MG 2.1 09/19/2022 12:00 AM       BP:   BP Readings from Last 1 Encounters:   08/17/23 129/81       ECG:   Most recent results for 12-Lead ECG   ECG 12-LEAD    Collection Time: 08/17/23  1:01 PM   Result Value Status    VENTRICULAR RATE 70 Final    P-R INTERVAL 244 Final    QRS DURATION 94 Final    Q-T INTERVAL 412 Final    QTC CALCULATION (BAZETT) 444 Final    P AXIS 37 Final    R AXIS -6 Final    T AXIS 33 Final    Impression    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 Not Applicable

## 2024-03-27 ENCOUNTER — Encounter: Admit: 2024-03-27 | Discharge: 2024-03-27 | Payer: MEDICARE

## 2024-03-27 ENCOUNTER — Ambulatory Visit: Admit: 2024-03-27 | Discharge: 2024-03-28 | Payer: MEDICARE

## 2024-03-27 DIAGNOSIS — H903 Sensorineural hearing loss, bilateral: Principal | ICD-10-CM

## 2024-03-27 NOTE — Progress Notes [1]
 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 He reports he is doing well with one on one and small groups at a short distant. He struggles in noise but finds his partner microphone is very helpful.     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 He has worn hearing aids for several years. His most recent pair was fit a Biochemist, Clinical.     Transport Planner Internal Device  Initial Stimulation Date  Surgery Date  Surgeon    R [employer] Mid scalar ultra 3D version 2 04/01/2021 03/18/2021 Lynwood Farr, MD      Equipment:   Ear  Processor(s)  Magnet Strength  Processor Condition    R Naida CI M90 4 new     Equipment: new    Incision site: healed  Magnet site: good   Otoscopy: DNT    Programming:   Impedances: within normal limits  Electrodes: 16 is off for sound quality    Data Logging:   Did not make any programming adjustments today    Aided Testing:      Pre-op testing:  Test Binaural Hearing Aids Right Hearing Aid Only Left Hearing Aid Only   CNC Words  26% 30%   CNC Phonemes  46% 50%   AZ Bio Sentences   (in Quiet)  58% 37% 37%   AZ Bio Sentences   (+5 dB SNR) 12%         Testing was completed with a processor on the right/left ear. In the CI only condition, the contralateral ear was plugged.  Test  3 months 6 months 1 year 2 years 3 years   Az Bio (quiet) - CI only  30% 63% 83% 88% 86%   Az Bio (quiet) - CI + HA  73% 66% 81% 83%    Az Bio (+5 dB SNR) - CI + HA   7% 25% 4%    CNC (words) - CI only   56% 56% 66%    CNC (phonemes) - CI only   67% 72% 81%    Evaluation of Aural Rehabilitation Status: 35 minutes    Soundfield: good 30-35 dB  Hearing preservation: stable   Audiogram: left ear tested today- no changes since 03/2023     Hearing Aid  Manufacturer: Phonak   Model: Naida M90 link   Serial Numbers: [id number]   Repair Warranty: 06/12/2024  L&D Warranty: 06/12/2024  Battery: 13  Domes: medium power domes  Slimtube: Size 3    Summary:  [name] continues to report the most difficulties in groups and noise. A donated Roger select was given to [name]. He was given the [employer] success teams information to set up the select for him. He will need to charge it and make an appointment with AB. He voiced understanding.     Slim tube was replaced on his hearing aid. We did not have any size 2 left slimtubes in the office so he is wearing a size 3.     Recommendations:   [name] should return to the clinic in 2 years for 5 year re-programming and outcomes.

## 2024-03-31 ENCOUNTER — Encounter: Admit: 2024-03-31 | Discharge: 2024-03-31 | Payer: MEDICARE

## 2024-03-31 ENCOUNTER — Ambulatory Visit: Admit: 2024-03-31 | Discharge: 2024-03-31 | Payer: MEDICARE

## 2024-04-01 ENCOUNTER — Encounter: Admit: 2024-04-01 | Discharge: 2024-04-01 | Payer: MEDICARE

## 2024-04-01 NOTE — Telephone Encounter [36]
 Patient due for labs per amiodarone  testing. Attempted to contact patient by phone, but no answer. LMTCB.

## 2024-04-11 ENCOUNTER — Encounter: Admit: 2024-04-11 | Discharge: 2024-04-11 | Payer: MEDICARE

## 2024-04-15 ENCOUNTER — Encounter: Admit: 2024-04-15 | Discharge: 2024-04-15 | Payer: MEDICARE

## 2024-05-01 ENCOUNTER — Encounter: Admit: 2024-05-01 | Discharge: 2024-05-01 | Payer: MEDICARE

## 2024-05-02 ENCOUNTER — Encounter: Admit: 2024-05-02 | Discharge: 2024-05-02 | Payer: MEDICARE

## 2024-05-02 ENCOUNTER — Ambulatory Visit: Admit: 2024-05-02 | Discharge: 2024-05-03 | Payer: MEDICARE

## 2024-05-15 ENCOUNTER — Encounter: Admit: 2024-05-15 | Discharge: 2024-05-15 | Payer: MEDICARE

## 2024-05-15 DIAGNOSIS — Z79899 Other long term (current) drug therapy: Principal | ICD-10-CM

## 2024-05-15 NOTE — Progress Notes [1]
 Request for the following medical records for purpose of continuity of care:      [facility] [place], [place])  F/ [phone]     Please fax lab results collected on/around  02/19/2024 for Dr. Liborio to review for medication management.  (Mag, TSH, T4, Mag, ALT, AST)     Please Fax to: [phone]  Cardiology Services at the [facility]   Dr. Liborio  Attention:  Bernarda, RN     Thank you

## 2024-05-21 ENCOUNTER — Encounter: Admit: 2024-05-21 | Discharge: 2024-05-21 | Payer: MEDICARE

## 2024-05-21 DIAGNOSIS — G5793 Unspecified mononeuropathy of bilateral lower limbs: Principal | ICD-10-CM

## 2024-05-21 DIAGNOSIS — M792 Neuralgia and neuritis, unspecified: Secondary | ICD-10-CM
